# Patient Record
Sex: Male | Born: 1965 | Race: White | Hispanic: No | Marital: Married | Smoking: Never smoker
Health system: Southern US, Community
[De-identification: ages and names within clinical notes are randomized; demographics above are authoritative.]

## PROBLEM LIST (undated history)

## (undated) DIAGNOSIS — Z8249 Family history of ischemic heart disease and other diseases of the circulatory system: Secondary | ICD-10-CM

## (undated) DIAGNOSIS — K4090 Unilateral inguinal hernia, without obstruction or gangrene, not specified as recurrent: Secondary | ICD-10-CM

## (undated) DIAGNOSIS — K219 Gastro-esophageal reflux disease without esophagitis: Secondary | ICD-10-CM

## (undated) DIAGNOSIS — E782 Mixed hyperlipidemia: Secondary | ICD-10-CM

## (undated) HISTORY — PX: HERNIA REPAIR: SHX51

## (undated) HISTORY — DX: Family history of ischemic heart disease and other diseases of the circulatory system: Z82.49

## (undated) HISTORY — DX: Unilateral inguinal hernia, without obstruction or gangrene, not specified as recurrent: K40.90

## (undated) HISTORY — DX: Mixed hyperlipidemia: E78.2

## (undated) HISTORY — DX: Gastro-esophageal reflux disease without esophagitis: K21.9

---

## 2003-05-26 ENCOUNTER — Encounter: Payer: Self-pay | Admitting: Emergency Medicine

## 2003-05-26 ENCOUNTER — Emergency Department (HOSPITAL_COMMUNITY): Admission: EM | Admit: 2003-05-26 | Discharge: 2003-05-26 | Payer: Self-pay | Admitting: Emergency Medicine

## 2003-07-22 ENCOUNTER — Encounter: Payer: Self-pay | Admitting: Emergency Medicine

## 2003-07-22 ENCOUNTER — Encounter: Admission: RE | Admit: 2003-07-22 | Discharge: 2003-07-22 | Payer: Self-pay | Admitting: Emergency Medicine

## 2003-08-06 ENCOUNTER — Encounter: Payer: Self-pay | Admitting: General Surgery

## 2003-08-06 ENCOUNTER — Ambulatory Visit (HOSPITAL_COMMUNITY): Admission: RE | Admit: 2003-08-06 | Discharge: 2003-08-06 | Payer: Self-pay | Admitting: General Surgery

## 2003-12-09 ENCOUNTER — Encounter: Admission: RE | Admit: 2003-12-09 | Discharge: 2003-12-09 | Payer: Self-pay | Admitting: General Surgery

## 2004-07-07 ENCOUNTER — Ambulatory Visit (HOSPITAL_COMMUNITY): Admission: RE | Admit: 2004-07-07 | Discharge: 2004-07-07 | Payer: Self-pay | Admitting: *Deleted

## 2004-07-07 ENCOUNTER — Encounter (INDEPENDENT_AMBULATORY_CARE_PROVIDER_SITE_OTHER): Payer: Self-pay | Admitting: *Deleted

## 2005-01-25 ENCOUNTER — Encounter: Admission: RE | Admit: 2005-01-25 | Discharge: 2005-01-25 | Payer: Self-pay | Admitting: Family Medicine

## 2006-07-26 ENCOUNTER — Emergency Department (HOSPITAL_COMMUNITY): Admission: EM | Admit: 2006-07-26 | Discharge: 2006-07-26 | Payer: Self-pay | Admitting: Emergency Medicine

## 2009-08-18 ENCOUNTER — Ambulatory Visit (HOSPITAL_BASED_OUTPATIENT_CLINIC_OR_DEPARTMENT_OTHER): Admission: RE | Admit: 2009-08-18 | Discharge: 2009-08-18 | Payer: Self-pay | Admitting: General Surgery

## 2011-02-10 LAB — DIFFERENTIAL
Lymphocytes Relative: 28 % (ref 12–46)
Lymphs Abs: 2 10*3/uL (ref 0.7–4.0)
Monocytes Relative: 7 % (ref 3–12)
Neutro Abs: 4.5 10*3/uL (ref 1.7–7.7)
Neutrophils Relative %: 63 % (ref 43–77)

## 2011-02-10 LAB — CBC
RBC: 5.34 MIL/uL (ref 4.22–5.81)
WBC: 7.1 10*3/uL (ref 4.0–10.5)

## 2011-02-10 LAB — BASIC METABOLIC PANEL
Calcium: 9.5 mg/dL (ref 8.4–10.5)
Chloride: 102 mEq/L (ref 96–112)
Creatinine, Ser: 1 mg/dL (ref 0.4–1.5)
GFR calc Af Amer: >60 mL/min (ref >60)
GFR calc non Af Amer: >60 mL/min (ref >60)

## 2011-10-18 ENCOUNTER — Encounter (INDEPENDENT_AMBULATORY_CARE_PROVIDER_SITE_OTHER): Payer: Self-pay | Admitting: General Surgery

## 2011-11-02 ENCOUNTER — Encounter (INDEPENDENT_AMBULATORY_CARE_PROVIDER_SITE_OTHER): Payer: Self-pay | Admitting: General Surgery

## 2011-11-02 ENCOUNTER — Ambulatory Visit (INDEPENDENT_AMBULATORY_CARE_PROVIDER_SITE_OTHER): Payer: BC Managed Care – PPO | Admitting: General Surgery

## 2011-11-02 VITALS — BP 130/84 | HR 68 | Temp 98.1°F | Resp 16 | Ht 75.0 in | Wt 245.4 lb

## 2011-11-02 DIAGNOSIS — R1032 Left lower quadrant pain: Secondary | ICD-10-CM

## 2011-11-02 MED ORDER — PREGABALIN 75 MG PO CAPS: 75.0000 mg | ORAL_CAPSULE | Freq: Two times a day (BID) | ORAL | Status: DC

## 2011-11-02 NOTE — Progress Notes (Signed)
Subjective:     Patient ID: Brian Andrade, male   DOB: 1966/09/06, 45 y.o.   MRN: [id number]  HPI Patient is status post left in the hernia repair by Dr. Zachery Dakins in October of 2010. Recently he has been having some pain at the incision site. It extends superiorly on his abdomen. He has not noticed a bulge in the area. Certain activities and such activity exacerbate the discomfort. He has taken ibuprofen with some relief.  Review of Systems     Objective:   Physical Exam  Neck: Normal range of motion. Neck supple.  Cardiovascular: Normal rate, regular rhythm and normal heart sounds.   Pulmonary/Chest: Effort normal and breath sounds normal. No respiratory distress. He has no wheezes.  Abdominal: Soft. He exhibits no distension. There is no tenderness. There is no rebound.   Both testes are descended. There is no evidence of right inguinal hernia. There is no evidence of recurrent lifting or hernia. There is mild tenderness along the incision site. No masses or signs of infection are seen.    Assessment:     Pain after left inguinal hernia repair likely due to scar tissue affecting the nerves in the area    Plan:     I will try a short course of Lyrica. This should help resolve the nerve irritation. We discussed this in detail. I will see him back in 6 weeks.

## 2012-01-04 ENCOUNTER — Encounter (INDEPENDENT_AMBULATORY_CARE_PROVIDER_SITE_OTHER): Payer: BC Managed Care – PPO | Admitting: General Surgery

## 2013-12-13 ENCOUNTER — Telehealth (INDEPENDENT_AMBULATORY_CARE_PROVIDER_SITE_OTHER): Payer: Self-pay

## 2013-12-13 NOTE — Telephone Encounter (Signed)
Patient calling into office requesting a refill for Lyrica.  Patient sates he was climbing a tree and felt sharp pain.  He denies having any bulges at this time.  Patient last seen on 11/02/2011 and was placed on a short interval of Lyrica for nerve pain however patient is calling back requesting a prescription for this medication.  Patient has scheduled an office visit for 12/23/13 w/Dr. Janee Mornhompson.

## 2013-12-16 ENCOUNTER — Other Ambulatory Visit (INDEPENDENT_AMBULATORY_CARE_PROVIDER_SITE_OTHER): Payer: Self-pay

## 2013-12-16 DIAGNOSIS — R1030 Lower abdominal pain, unspecified: Secondary | ICD-10-CM

## 2013-12-16 MED ORDER — PREGABALIN 75 MG PO CAPS: 75.0000 mg | ORAL_CAPSULE | Freq: Two times a day (BID) | ORAL | Status: DC

## 2013-12-16 NOTE — Telephone Encounter (Signed)
If this can be called in he can have Lyrica 75mg  po bid prn pain #50. Thanks

## 2013-12-16 NOTE — Telephone Encounter (Signed)
RX for lyrica 75 mg sent to [facility] via epic per Dr Carollee Massedhompson's request. Pt to keep appt 12-23-13.

## 2013-12-23 ENCOUNTER — Ambulatory Visit (INDEPENDENT_AMBULATORY_CARE_PROVIDER_SITE_OTHER): Payer: BC Managed Care – PPO | Admitting: General Surgery

## 2013-12-23 ENCOUNTER — Encounter (INDEPENDENT_AMBULATORY_CARE_PROVIDER_SITE_OTHER): Payer: Self-pay | Admitting: General Surgery

## 2013-12-23 VITALS — BP 130/82 | HR 68 | Temp 97.7°F | Resp 16 | Ht 75.0 in | Wt 247.8 lb

## 2013-12-23 DIAGNOSIS — R1032 Left lower quadrant pain: Secondary | ICD-10-CM | POA: Insufficient documentation

## 2013-12-23 DIAGNOSIS — R109 Unspecified abdominal pain: Secondary | ICD-10-CM

## 2013-12-23 MED ORDER — PREGABALIN 75 MG PO CAPS: 75.0000 mg | ORAL_CAPSULE | Freq: Two times a day (BID) | ORAL | Status: DC

## 2013-12-23 NOTE — Progress Notes (Signed)
Subjective:     Patient ID: Brian NasutiRonald C Ebbert, male   DOB: 11/11/1965, 48 y.o.   MRN: [id number]  HPI  Patient status post left single hernia repair with mesh approximately January of 2013. He was climbing a tree to retrieve a deer stand while hunting recently when he twisted and felt some pain in his right inguinal region. He previously had a brief period of neuropathy postop which was treated successfully with Lyrica. He returns to make sure his hernia repair remains intact. He continues to have intermittent pain in the area exacerbated by movement. He is felt no mass. Review of Systems     Objective:   Physical Exam  Constitutional: He is oriented to person, place, and time. He appears well-developed and well-nourished.  HENT:  Head: Normocephalic and atraumatic.  Neck: Neck supple.  Cardiovascular: Normal rate and normal heart sounds.   Pulmonary/Chest: Effort normal and breath sounds normal. No respiratory distress. He has no wheezes. He has no rales.  Abdominal: Soft. Bowel sounds are normal. He exhibits no distension. There is no tenderness.  Left inguinal area as well healed scar, hernia repair is intact, no evidence right inguinal hernia, testes descended and nonedematous  Neurological: He is alert and oriented to person, place, and time.       Assessment:     Re-exacerbation of nerve irritation associated pain status post lifting hernia repair with mesh. No evidence of hernia recurrence.    Plan:     Lyrica 75 mg twice a day. I will see him back in approximately 6 weeks. He will complete one month of treatment. If his pain is resolved, he will not refill. In any case I will see him back for further evaluation.

## 2014-02-05 ENCOUNTER — Encounter (INDEPENDENT_AMBULATORY_CARE_PROVIDER_SITE_OTHER): Payer: BC Managed Care – PPO | Admitting: General Surgery

## 2014-06-24 ENCOUNTER — Emergency Department (HOSPITAL_COMMUNITY)
Admission: EM | Admit: 2014-06-24 | Discharge: 2014-06-24 | Disposition: A | Discharge status: Home / Self Care | Payer: BC Managed Care – PPO | Source: Home / Self Care | Attending: Family Medicine | Admitting: Family Medicine

## 2014-06-24 ENCOUNTER — Encounter (HOSPITAL_COMMUNITY): Payer: Self-pay | Admitting: Emergency Medicine

## 2014-06-24 DIAGNOSIS — R0789 Other chest pain: Secondary | ICD-10-CM

## 2014-06-24 DIAGNOSIS — R071 Chest pain on breathing: Secondary | ICD-10-CM

## 2014-06-24 MED ORDER — KETOROLAC TROMETHAMINE 30 MG/ML IJ SOLN
INTRAMUSCULAR | Status: AC
  Filled 2014-06-24: qty 1

## 2014-06-24 MED ORDER — METAXALONE 800 MG PO TABS: 800.0000 mg | ORAL_TABLET | Freq: Three times a day (TID) | ORAL | Status: DC

## 2014-06-24 MED ORDER — KETOROLAC TROMETHAMINE 30 MG/ML IJ SOLN
30.0000 mg | Freq: Once | INTRAMUSCULAR | Status: AC
  Administered 2014-06-24: 30 mg via INTRAMUSCULAR

## 2014-06-24 NOTE — ED Provider Notes (Signed)
CSN: [id number]     Arrival date & time 06/24/14  9147 History   First MD Initiated Contact with Patient 06/24/14 1001     Chief Complaint  Patient presents with  . Neck Pain   (Consider location/radiation/quality/duration/timing/severity/associated sxs/prior Treatment) Patient is a 48 y.o. male presenting with neck pain. The history is provided by the patient.  Neck Pain Pain location:  L side Quality:  Stabbing Pain radiates to:  L shoulder Pain severity:  Mild Onset quality:  Gradual Progression:  Waxing and waning Chronicity:  New Context: lifting a heavy object   Relieved by:  None tried Worsened by:  Nothing tried Ineffective treatments:  None tried Associated symptoms: chest pain and headaches   Associated symptoms: no leg pain and no numbness   Risk factors comment:  Works in [employer], worried b/o friend died of mi age 59   Past Medical History  Diagnosis Date  . GERD (gastroesophageal reflux disease)   . Elevated cholesterol with elevated triglycerides   . Family history of early CAD   . Left inguinal hernia   . Abdominal pain     lower left   Past Surgical History  Procedure Laterality Date  . Hernia repair  1975, 2010   Family History  Problem Relation Age of Onset  . Hyperlipidemia Mother   . Cancer Mother     breast  . Heart disease Father   . Hyperlipidemia Father   . Hyperlipidemia Paternal Grandmother   . Hypertension Paternal Grandmother   . Diabetes Paternal Grandmother   . Cancer Maternal Grandfather     lung   History  Substance Use Topics  . Smoking status: Never Smoker   . Smokeless tobacco: Former Neurosurgeon  . Alcohol Use: No    Review of Systems  Constitutional: Negative.   Respiratory: Negative for cough and shortness of breath.   Cardiovascular: Positive for chest pain. Negative for palpitations and leg swelling.  Gastrointestinal: Negative.   Musculoskeletal: Positive for neck pain.  Neurological: Positive for headaches.  Negative for numbness.    Allergies  Celebrex  Home Medications   Prior to Admission medications   Medication Sig Start Date End Date Taking? Authorizing Provider  aspirin 81 MG tablet Take 81 mg by mouth daily.    Historical Provider, MD  glucosamine-chondroitin 500-400 MG tablet Take 1 tablet by mouth 3 (three) times daily.      Historical Provider, MD  ibuprofen (ADVIL,MOTRIN) 800 MG tablet as needed. 10/12/11   Historical Provider, MD  metaxalone (SKELAXIN) 800 MG tablet Take 1 tablet (800 mg total) by mouth 3 (three) times daily. As muscle relaxer 06/24/14   Linna Hoff, MD  Multiple Vitamins-Minerals (MULTIVITAMIN PO) Take 1 tablet by mouth daily.      Historical Provider, MD  Omega-3 Fatty Acids (FISH OIL) 600 MG CAPS Take 4 tablets by mouth daily.      Historical Provider, MD  omeprazole (PRILOSEC) 20 MG capsule Take 20 mg by mouth daily.      Historical Provider, MD  pregabalin (LYRICA) 75 MG capsule Take 1 capsule (75 mg total) by mouth 2 (two) times daily. 12/23/13   Liz Malady, MD  Red Yeast Rice 600 MG CAPS Take 2 capsules by mouth daily.      Historical Provider, MD   BP 119/81  Pulse 65  Temp(Src) 97.3 F (36.3 C) (Oral)  Resp 16  SpO2 97% Physical Exam  Nursing note and vitals reviewed. Constitutional: He is oriented to person,  place, and time. He appears well-developed and well-nourished.  Neck: Normal range of motion. Neck supple.  Cardiovascular: Regular rhythm, normal heart sounds and intact distal pulses.   Pulmonary/Chest: Breath sounds normal. He exhibits tenderness.  Abdominal: Soft. Bowel sounds are normal. There is no tenderness.  Lymphadenopathy:    He has no cervical adenopathy.  Neurological: He is alert and oriented to person, place, and time.  Skin: Skin is warm and dry.    ED Course  Procedures (including critical care time) Labs Review Labs Reviewed - No data to display  Imaging Review No results found.   MDM   1. Left-sided  chest wall pain        Linna HoffJames D Joshuan Bolander, MD 06/24/14 1108

## 2014-06-24 NOTE — ED Notes (Signed)
Pt  Reports  Neck  l  Shoulder  l   Arm  l  Side  Of  Chest  Pain  Worse  On  Certain movements  And  posistions   With  Onset  Of  Symptoms  X  4  Days       denys  Any  specefic  injurys     Pt reports  Some  nasusea   As  Well  As  Headache     - he is  Sitting upright ion  The  Exam table  Speaking in  Complete  sentances     Skin is  Warm  And  Dry

## 2014-06-24 NOTE — Discharge Instructions (Signed)
Motrin and heat and muscle relaxer.

## 2015-01-15 ENCOUNTER — Emergency Department (HOSPITAL_COMMUNITY)
Admission: EM | Admit: 2015-01-15 | Discharge: 2015-01-16 | Disposition: A | Discharge status: Home / Self Care | Payer: BLUE CROSS/BLUE SHIELD | Attending: Emergency Medicine | Admitting: Emergency Medicine

## 2015-01-15 ENCOUNTER — Encounter (HOSPITAL_COMMUNITY): Payer: Self-pay | Admitting: Emergency Medicine

## 2015-01-15 ENCOUNTER — Emergency Department (HOSPITAL_COMMUNITY): Payer: BLUE CROSS/BLUE SHIELD

## 2015-01-15 DIAGNOSIS — Z8639 Personal history of other endocrine, nutritional and metabolic disease: Secondary | ICD-10-CM | POA: Insufficient documentation

## 2015-01-15 DIAGNOSIS — K219 Gastro-esophageal reflux disease without esophagitis: Secondary | ICD-10-CM | POA: Insufficient documentation

## 2015-01-15 DIAGNOSIS — R110 Nausea: Secondary | ICD-10-CM | POA: Diagnosis not present

## 2015-01-15 DIAGNOSIS — Z7982 Long term (current) use of aspirin: Secondary | ICD-10-CM | POA: Insufficient documentation

## 2015-01-15 DIAGNOSIS — Z79899 Other long term (current) drug therapy: Secondary | ICD-10-CM | POA: Insufficient documentation

## 2015-01-15 DIAGNOSIS — R079 Chest pain, unspecified: Secondary | ICD-10-CM

## 2015-01-15 DIAGNOSIS — R1013 Epigastric pain: Secondary | ICD-10-CM | POA: Diagnosis not present

## 2015-01-15 LAB — CBC
HCT: 45.7 % (ref 39.0–52.0)
Hemoglobin: 16.5 g/dL (ref 13.0–17.0)
MCH: 30.5 pg (ref 26.0–34.0)
MCHC: 36.1 g/dL — ABNORMAL HIGH (ref 30.0–36.0)
MCV: 84.5 fL (ref 78.0–100.0)
PLATELETS: 156 10*3/uL (ref 150–400)
RBC: 5.41 MIL/uL (ref 4.22–5.81)
RDW: 12 % (ref 11.5–15.5)
WBC: 7.1 10*3/uL (ref 4.0–10.5)

## 2015-01-15 LAB — BASIC METABOLIC PANEL
ANION GAP: 7 (ref 5–15)
BUN: 16 mg/dL (ref 6–23)
CO2: 28 mmol/L (ref 19–32)
Calcium: 9.6 mg/dL (ref 8.4–10.5)
Chloride: 104 mmol/L (ref 96–112)
Creatinine, Ser: 1.11 mg/dL (ref 0.50–1.35)
GFR calc Af Amer: 89 mL/min — ABNORMAL LOW (ref >90)
GFR calc non Af Amer: 77 mL/min — ABNORMAL LOW (ref >90)
Glucose, Bld: 118 mg/dL — ABNORMAL HIGH (ref 70–99)
POTASSIUM: 3.8 mmol/L (ref 3.5–5.1)
Sodium: 139 mmol/L (ref 135–145)

## 2015-01-15 LAB — I-STAT TROPONIN, ED: TROPONIN I, POC: 0 ng/mL (ref 0.00–0.08)

## 2015-01-15 NOTE — ED Provider Notes (Signed)
CSN: [id number]     Arrival date & time 01/15/15  2117 History  This chart was scribed for Brian Booze, MD by Richarda Overlie, ED Scribe. This patient was seen in room A08C/A08C and the patient's care was started 12:00 AM.    Chief Complaint  Patient presents with  . Chest Pain   The history is provided by the patient. No language interpreter was used.   HPI Comments: EARNIE Andrade is a 49 y.o. male with a history of GERD who presents to the Emergency Department complaining of worsening, left sided CP for the last 4 days. Pt describes the pain as burning and says that his pain radiates down his left shoulder and into his left arm. He rates his CP as a 4/10 at this time and rates it as a 6/10 at its worst. He says that his pain worsens with deep breathing. Pt reports associated nausea as well. Pt reports he has taken aleve, ibuprofen and asprin which have failed to provide him pain relief. He states he is still taking Prilosec and Omeprazole regularly. Pt states his last dose of aspirin  was yesterday morning. He reports a similar prior episode a few years ago and reports that his cardiac workup was normal. Pt reports that his father had a heart blockage at 73 years old. Pt reports no alleviating factors at this time. He denies SOB, vomiting and diaphoresis.   PCP - Dr. Azucena Cecil   Past Medical History  Diagnosis Date  . GERD (gastroesophageal reflux disease)   . Elevated cholesterol with elevated triglycerides   . Family history of early CAD   . Left inguinal hernia   . Abdominal pain     lower left   Past Surgical History  Procedure Laterality Date  . Hernia repair  1975, 2010   Family History  Problem Relation Age of Onset  . Hyperlipidemia Mother   . Cancer Mother     breast  . Heart disease Father   . Hyperlipidemia Father   . Hyperlipidemia Paternal Grandmother   . Hypertension Paternal Grandmother   . Diabetes Paternal Grandmother   . Cancer Maternal Grandfather     lung    History  Substance Use Topics  . Smoking status: Never Smoker   . Smokeless tobacco: Former Neurosurgeon  . Alcohol Use: No    Review of Systems  Constitutional: Negative for diaphoresis.  Respiratory: Negative for shortness of breath.   Cardiovascular: Positive for chest pain.  Gastrointestinal: Positive for nausea. Negative for vomiting.  All other systems reviewed and are negative.     Allergies  Celebrex  Home Medications   Prior to Admission medications   Medication Sig Start Date End Date Taking? Authorizing Provider  aspirin 81 MG tablet Take 81 mg by mouth daily.    Historical Provider, MD  glucosamine-chondroitin 500-400 MG tablet Take 1 tablet by mouth 3 (three) times daily.      Historical Provider, MD  ibuprofen (ADVIL,MOTRIN) 800 MG tablet as needed. 10/12/11   Historical Provider, MD  metaxalone (SKELAXIN) 800 MG tablet Take 1 tablet (800 mg total) by mouth 3 (three) times daily. As muscle relaxer 06/24/14   Linna Hoff, MD  Multiple Vitamins-Minerals (MULTIVITAMIN PO) Take 1 tablet by mouth daily.      Historical Provider, MD  Omega-3 Fatty Acids (FISH OIL) 600 MG CAPS Take 4 tablets by mouth daily.      Historical Provider, MD  omeprazole (PRILOSEC) 20 MG capsule Take 20 mg  by mouth daily.      Historical Provider, MD  pregabalin (LYRICA) 75 MG capsule Take 1 capsule (75 mg total) by mouth 2 (two) times daily. 12/23/13   Violeta GelinasBurke Thompson, MD  Red Yeast Rice 600 MG CAPS Take 2 capsules by mouth daily.      Historical Provider, MD   BP 114/79 mmHg  Pulse 51  Temp(Src) 98.5 F (36.9 C)  Resp 18  SpO2 99% Physical Exam  Constitutional: He is oriented to person, place, and time. He appears well-developed and well-nourished.  HENT:  Head: Normocephalic and atraumatic.  Eyes: Pupils are equal, round, and reactive to light. Right eye exhibits no discharge. Left eye exhibits no discharge.  Neck: Normal range of motion. Neck supple. No JVD present.  Cardiovascular: Normal  rate, regular rhythm and normal heart sounds.   No murmur heard. Pulmonary/Chest: Effort normal. He has no wheezes. He has no rales. He exhibits no tenderness.  Abdominal: Soft. He exhibits no distension and no mass. There is tenderness.  Mild epigastric tenderness.   Musculoskeletal: Normal range of motion. He exhibits no edema.  Lymphadenopathy:    He has no cervical adenopathy.  Neurological: He is alert and oriented to person, place, and time. No cranial nerve deficit. Coordination normal.  Skin: Skin is warm and dry. No rash noted.  Psychiatric: He has a normal mood and affect. His behavior is normal. Thought content normal.  Nursing note and vitals reviewed.   ED Course  Procedures   DIAGNOSTIC STUDIES: Oxygen Saturation is 98% on RA, normal by my interpretation.    COORDINATION OF CARE: 12:09 AM Discussed treatment plan with pt at bedside and pt agreed to plan.   Labs Review Results for orders placed or performed during the hospital encounter of 01/15/15  CBC  Result Value Ref Range   WBC 7.1 4.0 - 10.5 K/uL   RBC 5.41 4.22 - 5.81 MIL/uL   Hemoglobin 16.5 13.0 - 17.0 g/dL   HCT 62.145.7 30.839.0 - 65.752.0 %   MCV 84.5 78.0 - 100.0 fL   MCH 30.5 26.0 - 34.0 pg   MCHC 36.1 (H) 30.0 - 36.0 g/dL   RDW 84.612.0 96.211.5 - 95.215.5 %   Platelets 156 150 - 400 K/uL  Basic metabolic panel  Result Value Ref Range   Sodium 139 135 - 145 mmol/L   Potassium 3.8 3.5 - 5.1 mmol/L   Chloride 104 96 - 112 mmol/L   CO2 28 19 - 32 mmol/L   Glucose, Bld 118 (H) 70 - 99 mg/dL   BUN 16 6 - 23 mg/dL   Creatinine, Ser 8.411.11 0.50 - 1.35 mg/dL   Calcium 9.6 8.4 - 32.410.5 mg/dL   GFR calc non Af Amer 77 (L) >90 mL/min   GFR calc Af Amer 89 (L) >90 mL/min   Anion gap 7 5 - 15  I-Stat Troponin, ED (not [facility])  Result Value Ref Range   Troponin i, poc 0.00 0.00 - 0.08 ng/mL   Comment 3           Imaging Review Dg Chest 2 View  01/15/2015   CLINICAL DATA:  Left-sided chest and arm pain for 5 days.  EXAM:  CHEST  2 VIEW  COMPARISON:  07/26/2006  FINDINGS: The heart size and mediastinal contours are within normal limits. Both lungs are clear. The visualized skeletal structures are unremarkable.  IMPRESSION: No active cardiopulmonary disease.   Electronically Signed   By: Marisa CyphersWilliam  Stevens M.D.  On: 01/15/2015 22:25   US Abdomen Complete  01/16/2015   CLINICAL DATA:  Epigastric pain.  EXAM: ULTRASOUND ABDOMEN COMPLETE  COMPARISON:  None.  FINDINGS: Gallbladder: Partially distended. No gallstones or wall thickening visualized. No sonographic Murphy sign noted.  Common bile duct: Diameter: Normal, 4.4 mm.  Liver: No focal lesion identified. Within normal limits in parenchymal echogenicity.  IVC: No abnormality visualized.  Pancreas: Not well visualized, completely obscured by bowel gas.  Spleen: Elongated measuring 15.0 Cm  Right Kidney: Length: 11.2 cm. Echogenicity within normal limits. No mass or hydronephrosis visualized.  Left Kidney: Length: 11.6 cm. Echogenicity within normal limits. No mass or hydronephrosis visualized.  Abdominal aorta: No aneurysm visualized.  Other findings: None.  No ascites.  IMPRESSION: 1. 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 2. Otherwise unremarkable abdominal ultrasound.   Electronically Signed   By: Rubye Oaks M.D.   On: 01/16/2015 02:19     EKG Interpretation   Date/Time:  Thursday January 15 2015 21:20:42 EST Ventricular Rate:  58 PR Interval:  160 QRS Duration: 104 QT Interval:  378 QTC Calculation: 371 R Axis:   11 Text Interpretation:  Sinus bradycardia Septal infarct , age undetermined  Abnormal ECG When compared with ECG of 06/24/2014, No significant change  was found Confirmed by [facility]  MD, Jennesis Ramaswamy ([id number]) on 01/15/2015 11:55:04 PM      MDM   Final diagnoses:  Epigastric pain  Chest pain, unspecified chest pain type    Chest pain) not seem likely to be related to coronary artery disease. It is constant burning with some  epigastric tenderness. He has known problems with esophageal reflux. He was given a GI cocktail which did not significantly change the pain. Is also worried about possible biliary tract disease so ultrasound was obtained which did not show any evidence of cholelithiasis. He is discharged with prescription for tramadol and is referred back to PCP. On review of past records, he does have a prior ED visit for chest wall pain. I do feel that he would benefit from outpatient stress testing. He is also advised to increase his omeprazole to twice a day for the next 2 weeks.   I personally performed the services described in this documentation, which was scribed in my presence. The recorded information has been reviewed and is accurate.       Brian Booze, MD 01/16/15 [phone]

## 2015-01-15 NOTE — ED Notes (Signed)
[facility] for further evaluation of left sided chest pain for the past 4 days- pt reports pain has gotten worse today.  Pain radiates down left arm and into left shoulder.  Pain is worse with inspiration.  Respirations e/u at present, no acute distress noted.

## 2015-01-16 ENCOUNTER — Emergency Department (HOSPITAL_COMMUNITY): Payer: BLUE CROSS/BLUE SHIELD

## 2015-01-16 MED ORDER — GI COCKTAIL ~~LOC~~
30.0000 mL | Freq: Once | ORAL | Status: AC
  Administered 2015-01-16: 30 mL via ORAL
  Filled 2015-01-16: qty 30

## 2015-01-16 MED ORDER — TRAMADOL HCL 50 MG PO TABS: 50.0000 mg | ORAL_TABLET | Freq: Four times a day (QID) | ORAL | Status: DC | PRN

## 2015-01-16 MED ORDER — ASPIRIN 81 MG PO CHEW
324.0000 mg | CHEWABLE_TABLET | Freq: Once | ORAL | Status: AC
  Administered 2015-01-16: 324 mg via ORAL
  Filled 2015-01-16: qty 4

## 2015-01-16 NOTE — Discharge Instructions (Signed)
Start taking your omeprazole (Prilosec) twice a day. Return if symptoms are getting worse.  Chest Pain (Nonspecific) It is often hard to give a specific diagnosis for the cause of chest pain. There is always a chance that your pain could be related to something serious, such as a heart attack or a blood clot in the lungs. You need to follow up with your health care provider for further evaluation. CAUSES   Heartburn.  Pneumonia or bronchitis.  Anxiety or stress.  Inflammation around your heart (pericarditis) or lung (pleuritis or pleurisy).  A blood clot in the lung.  A collapsed lung (pneumothorax). It can develop suddenly on its own (spontaneous pneumothorax) or from trauma to the chest.  Shingles infection (herpes zoster virus). The chest wall is composed of bones, muscles, and cartilage. Any of these can be the source of the pain.  The bones can be bruised by injury.  The muscles or cartilage can be strained by coughing or overwork.  The cartilage can be affected by inflammation and become sore (costochondritis). DIAGNOSIS  Lab tests or other studies may be needed to find the cause of your pain. Your health care provider may have you take a test called an ambulatory electrocardiogram (ECG). An ECG records your heartbeat patterns over a 24-hour period. You may also have other tests, such as:  Transthoracic echocardiogram (TTE). During echocardiography, sound waves are used to evaluate how blood flows through your heart.  Transesophageal echocardiogram (TEE).  Cardiac monitoring. This allows your health care provider to monitor your heart rate and rhythm in real time.  Holter monitor. This is a portable device that records your heartbeat and can help diagnose heart arrhythmias. It allows your health care provider to track your heart activity for several days, if needed.  Stress tests by exercise or by giving medicine that makes the heart beat faster. TREATMENT   Treatment  depends on what may be causing your chest pain. Treatment may include:  Acid blockers for heartburn.  Anti-inflammatory medicine.  Pain medicine for inflammatory conditions.  Antibiotics if an infection is present.  You may be advised to change lifestyle habits. This includes stopping smoking and avoiding alcohol, caffeine, and chocolate.  You may be advised to keep your head raised (elevated) when sleeping. This reduces the chance of acid going backward from your stomach into your esophagus. Most of the time, nonspecific chest pain will improve within 2-3 days with rest and mild pain medicine.  HOME CARE INSTRUCTIONS   If antibiotics were prescribed, take them as directed. Finish them even if you start to feel better.  For the next few days, avoid physical activities that bring on chest pain. Continue physical activities as directed.  Do not use any tobacco products, including cigarettes, chewing tobacco, or electronic cigarettes.  Avoid drinking alcohol.  Only take medicine as directed by your health care provider.  Follow your health care provider's suggestions for further testing if your chest pain does not go away.  Keep any follow-up appointments you made. If you do not go to an appointment, you could develop lasting (chronic) problems with pain. If there is any problem keeping an appointment, call to reschedule. SEEK MEDICAL CARE IF:   Your chest pain does not go away, even after treatment.  You have a rash with blisters on your chest.  You have a fever. SEEK IMMEDIATE MEDICAL CARE IF:   You have increased chest pain or pain that spreads to your arm, neck, jaw, back, or abdomen.  You have shortness of breath.  You have an increasing cough, or you cough up blood.  You have severe back or abdominal pain.  You feel nauseous or vomit.  You have severe weakness.  You faint.  You have chills. This is an emergency. Do not wait to see if the pain will go away. Get  medical help at once. Call your local emergency services (911 in [place] Do not drive yourself to the hospital. MAKE SURE YOU:   Understand these instructions.  Will watch your condition.  Will get help right away if you are not doing well or get worse. Document Released: 08/03/2005 Document Revised: 10/29/2013 Document Reviewed: 05/29/2008 [facility] Patient Information 2015 [place], [place]. This information is not intended to replace advice given to you by your health care provider. Make sure you discuss any questions you have with your health care provider.  Tramadol tablets What is this medicine? TRAMADOL (TRA ma dole) is a pain reliever. It is used to treat moderate to severe pain in adults. This medicine may be used for other purposes; ask your health care provider or pharmacist if you have questions. COMMON BRAND NAME(S): Ultram What should I tell my health care provider before I take this medicine? They need to know if you have any of these conditions: -brain tumor -depression -drug abuse or addiction -head injury -if you frequently drink alcohol containing drinks -kidney disease or trouble passing urine -liver disease -lung disease, asthma, or breathing problems -seizures or epilepsy -suicidal thoughts, plans, or attempt; a previous suicide attempt by you or a family member -an unusual or allergic reaction to tramadol, codeine, other medicines, foods, dyes, or preservatives -pregnant or trying to get pregnant -breast-feeding How should I use this medicine? Take this medicine by mouth with a full glass of water. Follow the directions on the prescription label. If the medicine upsets your stomach, take it with food or milk. Do not take more medicine than you are told to take. Talk to your pediatrician regarding the use of this medicine in children. Special care may be needed. Overdosage: If you think you have taken too much of this medicine contact a poison control center or  emergency room at once. NOTE: This medicine is only for you. Do not share this medicine with others. What if I miss a dose? If you miss a dose, take it as soon as you can. If it is almost time for your next dose, take only that dose. Do not take double or extra doses. What may interact with this medicine? Do not take this medicine with any of the following medications: -MAOIs like Carbex, Eldepryl, Marplan, Nardil, and Parnate This medicine may also interact with the following medications: -alcohol or medicines that contain alcohol -antihistamines -benzodiazepines -bupropion -carbamazepine or oxcarbazepine -clozapine -cyclobenzaprine -digoxin -furazolidone -linezolid -medicines for depression, anxiety, or psychotic disturbances -medicines for migraine headache like almotriptan, eletriptan, frovatriptan, naratriptan, rizatriptan, sumatriptan, zolmitriptan -medicines for pain like pentazocine, buprenorphine, butorphanol, meperidine, nalbuphine, and propoxyphene -medicines for sleep -muscle relaxants -naltrexone -phenobarbital -phenothiazines like perphenazine, thioridazine, chlorpromazine, mesoridazine, fluphenazine, prochlorperazine, promazine, and trifluoperazine -procarbazine -warfarin This list may not describe all possible interactions. Give your health care provider a list of all the medicines, herbs, non-prescription drugs, or dietary supplements you use. Also tell them if you smoke, drink alcohol, or use illegal drugs. Some items may interact with your medicine. What should I watch for while using this medicine? Tell your doctor or health care professional if your pain does not go  away, if it gets worse, or if you have new or a different type of pain. You may develop tolerance to the medicine. Tolerance means that you will need a higher dose of the medicine for pain relief. Tolerance is normal and is expected if you take this medicine for a long time. Do not suddenly stop taking  your medicine because you may develop a severe reaction. Your body becomes used to the medicine. This does NOT mean you are addicted. Addiction is a behavior related to getting and using a drug for a non-medical reason. If you have pain, you have a medical reason to take pain medicine. Your doctor will tell you how much medicine to take. If your doctor wants you to stop the medicine, the dose will be slowly lowered over time to avoid any side effects. You may get drowsy or dizzy. Do not drive, use machinery, or do anything that needs mental alertness until you know how this medicine affects you. Do not stand or sit up quickly, especially if you are an older patient. This reduces the risk of dizzy or fainting spells. Alcohol can increase or decrease the effects of this medicine. Avoid alcoholic drinks. You may have constipation. Try to have a bowel movement at least every 2 to 3 days. If you do not have a bowel movement for 3 days, call your doctor or health care professional. Your mouth may get dry. Chewing sugarless gum or sucking hard candy, and drinking plenty of water may help. Contact your doctor if the problem does not go away or is severe. What side effects may I notice from receiving this medicine? Side effects that you should report to your doctor or health care professional as soon as possible: -allergic reactions like skin rash, itching or hives, swelling of the face, lips, or tongue -breathing difficulties, wheezing -confusion -itching -light headedness or fainting spells -redness, blistering, peeling or loosening of the skin, including inside the mouth -seizures Side effects that usually do not require medical attention (report to your doctor or health care professional if they continue or are bothersome): -constipation -dizziness -drowsiness -headache -nausea, vomiting This list may not describe all possible side effects. Call your doctor for medical advice about side effects. You  may report side effects to FDA at 1-800-FDA-1088. Where should I keep my medicine? Keep out of the reach of children. Store at room temperature between 15 and 30 degrees C (59 and 86 degrees F). Keep container tightly closed. Throw away any unused medicine after the expiration date. NOTE: This sheet is a summary. It may not cover all possible information. If you have questions about this medicine, talk to your doctor, pharmacist, or health care provider.  2015, [employer]. (2010-07-07 11:55:44)

## 2015-03-16 ENCOUNTER — Other Ambulatory Visit: Payer: Self-pay | Admitting: Family Medicine

## 2015-03-16 ENCOUNTER — Ambulatory Visit
Admission: RE | Admit: 2015-03-16 | Discharge: 2015-03-16 | Disposition: A | Discharge status: Home / Self Care | Payer: BLUE CROSS/BLUE SHIELD | Source: Ambulatory Visit | Attending: Family Medicine | Admitting: Family Medicine

## 2015-03-16 DIAGNOSIS — R059 Cough, unspecified: Secondary | ICD-10-CM

## 2015-03-16 DIAGNOSIS — R05 Cough: Secondary | ICD-10-CM

## 2015-05-19 ENCOUNTER — Other Ambulatory Visit: Payer: Self-pay | Admitting: Family Medicine

## 2015-05-19 DIAGNOSIS — R1031 Right lower quadrant pain: Secondary | ICD-10-CM

## 2015-05-21 ENCOUNTER — Ambulatory Visit
Admission: RE | Admit: 2015-05-21 | Discharge: 2015-05-21 | Disposition: A | Discharge status: Home / Self Care | Payer: BLUE CROSS/BLUE SHIELD | Source: Ambulatory Visit | Attending: Family Medicine | Admitting: Family Medicine

## 2015-05-21 DIAGNOSIS — R1031 Right lower quadrant pain: Secondary | ICD-10-CM

## 2015-05-21 MED ORDER — IOPAMIDOL (ISOVUE-300) INJECTION 61%
125.0000 mL | Freq: Once | INTRAVENOUS | Status: AC | PRN
  Administered 2015-05-21: 125 mL via INTRAVENOUS

## 2016-03-17 IMAGING — CR DG CHEST 2V
2 series · 2 of 2 positions shown · non-contrast
Comparison: 07/26/2006

CLINICAL DATA: Left-sided chest and arm pain for 5 days.

EXAM:
CHEST  2 VIEW

[chest pa]
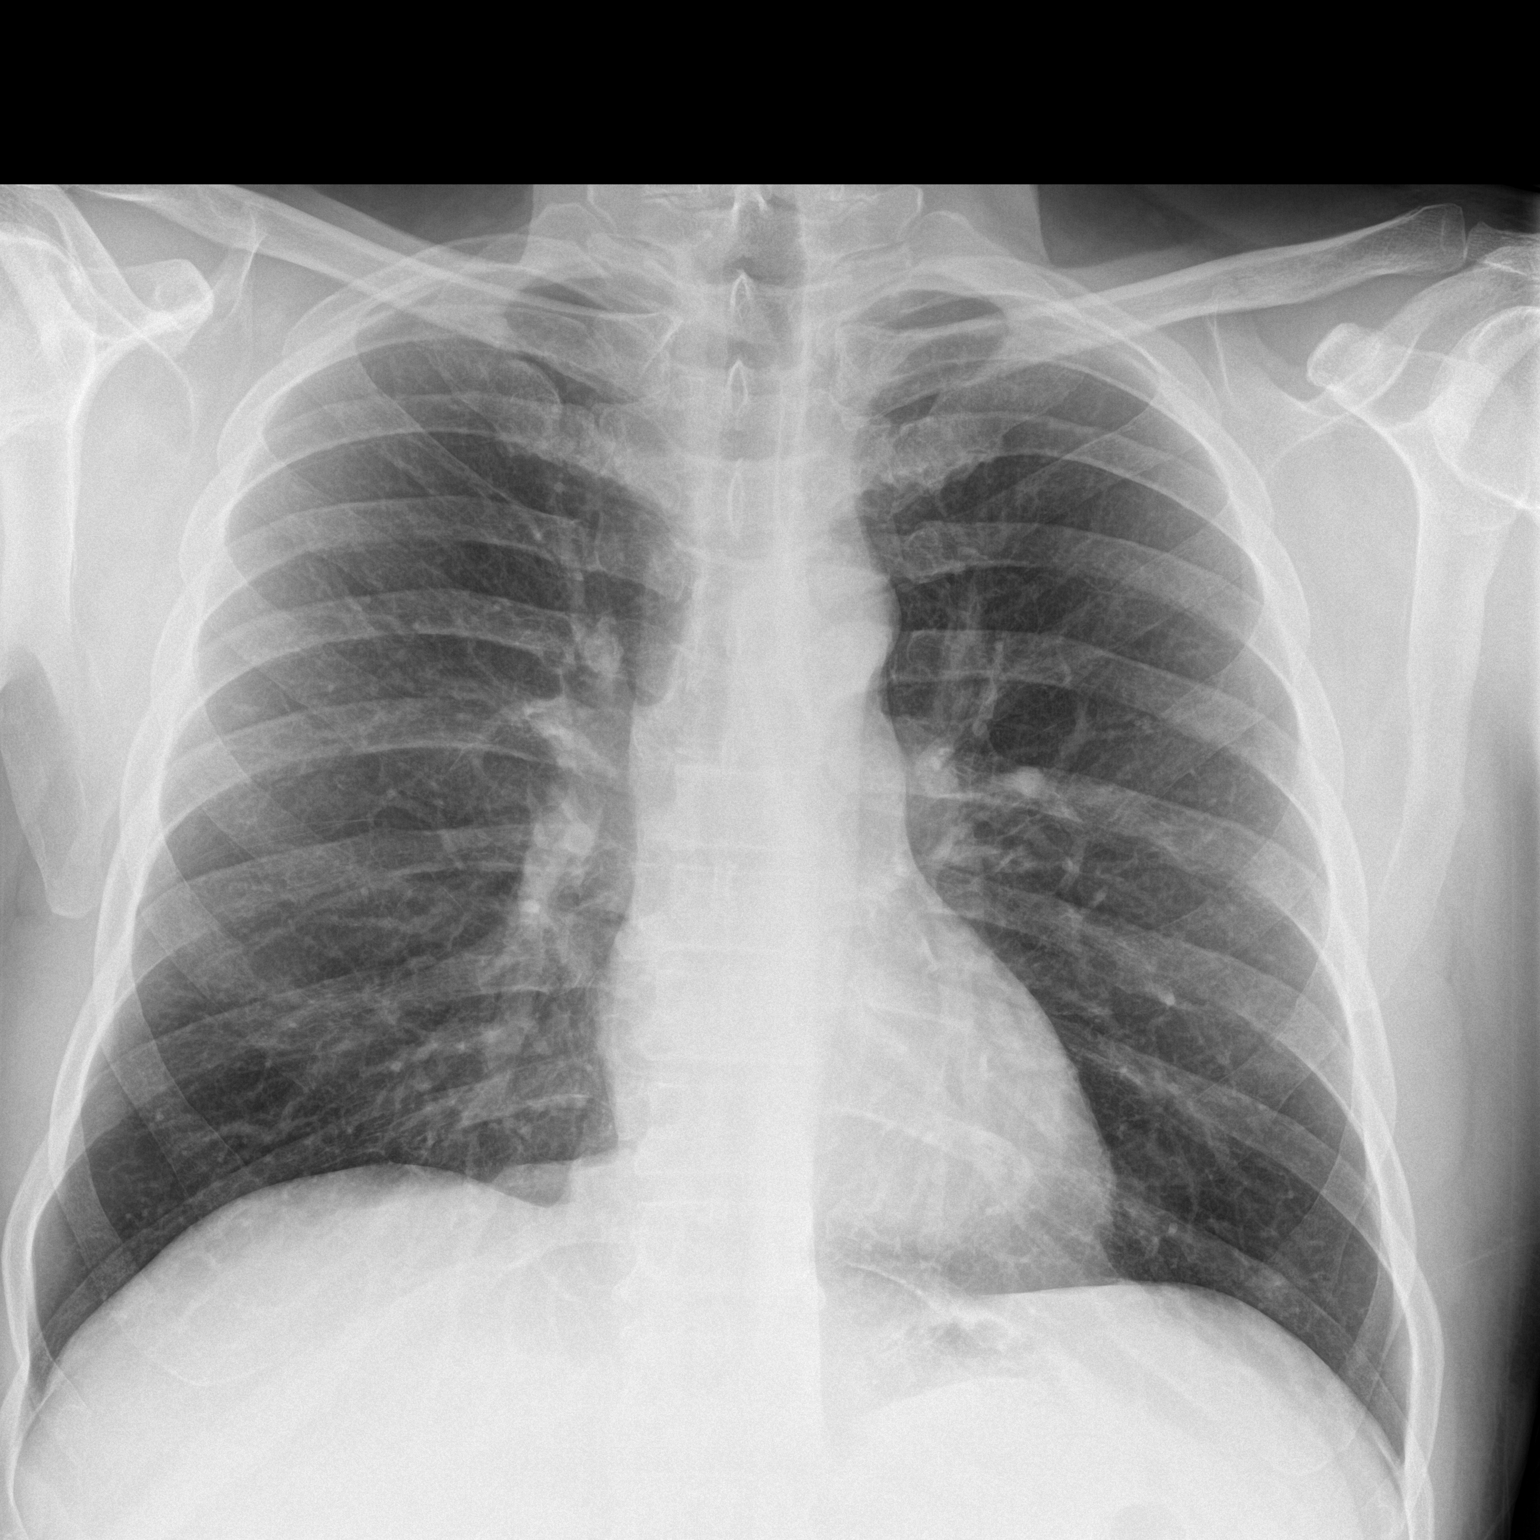

[chest lat]
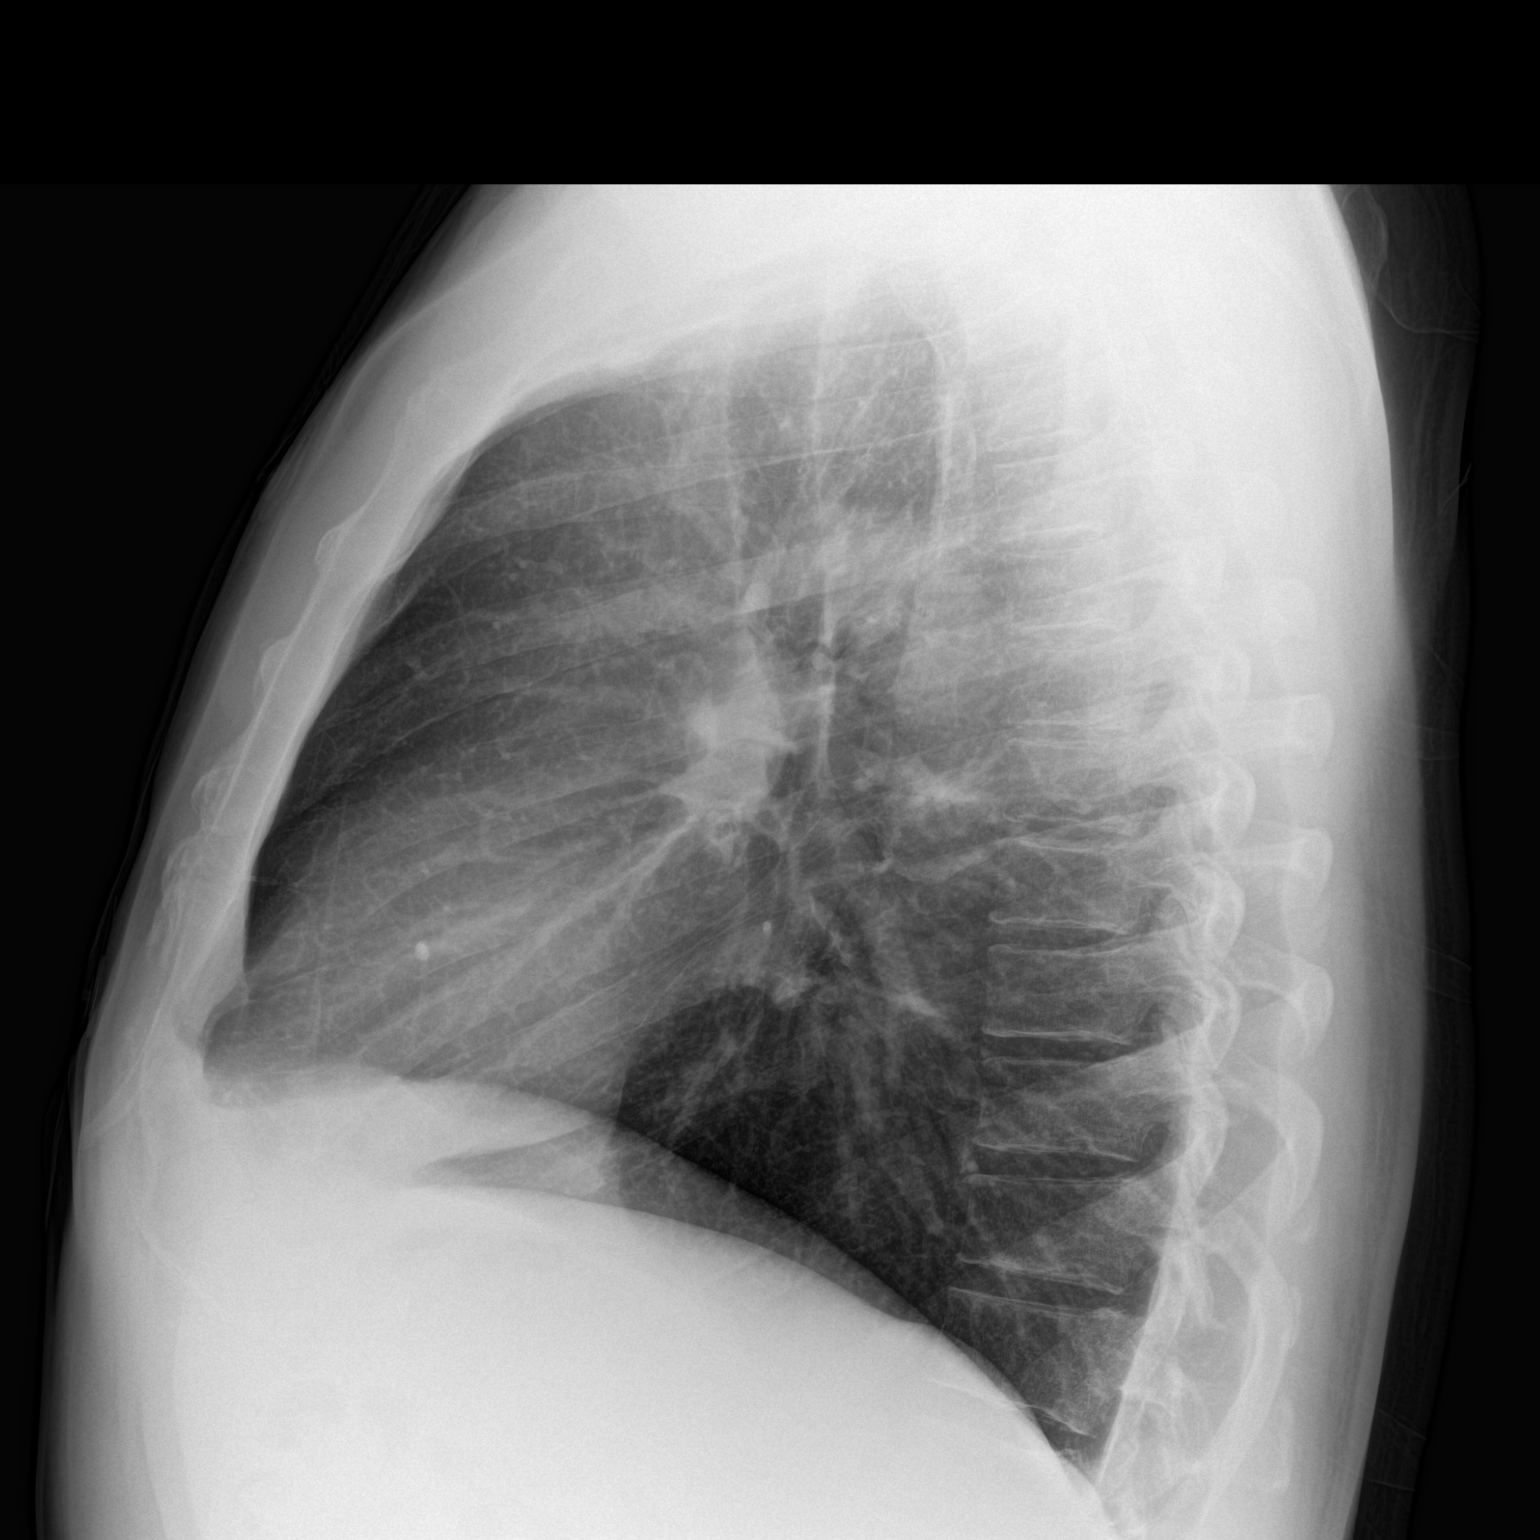

[2 of 2 positions shown; findings below may reference images not displayed]

FINDINGS: The heart size and mediastinal contours are within normal limits.
Both lungs are clear. The visualized skeletal structures are
unremarkable.
IMPRESSION: No active cardiopulmonary disease.

## 2016-03-18 IMAGING — US US ABDOMEN COMPLETE
1 series · 14 of 25 positions shown · non-contrast
Comparison: None.

CLINICAL DATA: Epigastric pain.

EXAM:
ULTRASOUND ABDOMEN COMPLETE

[Series 1: us abdomen complete · 0.26mm/px · 14 of 45 slices shown]
[im 1/45]
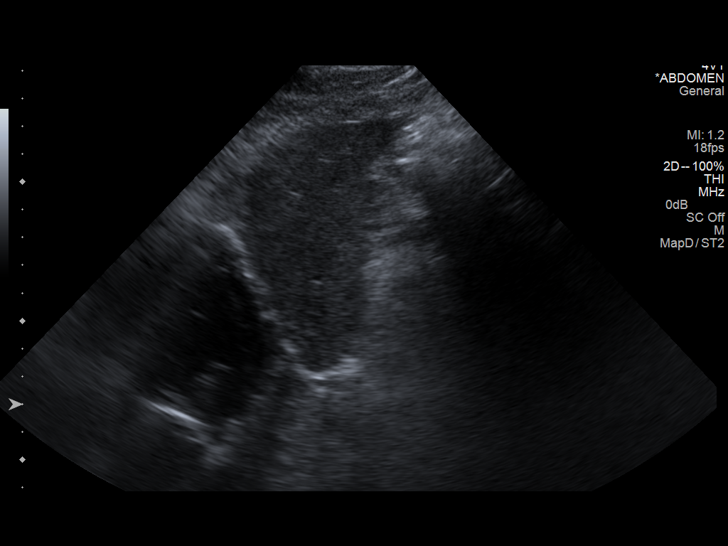
[im 4/45]
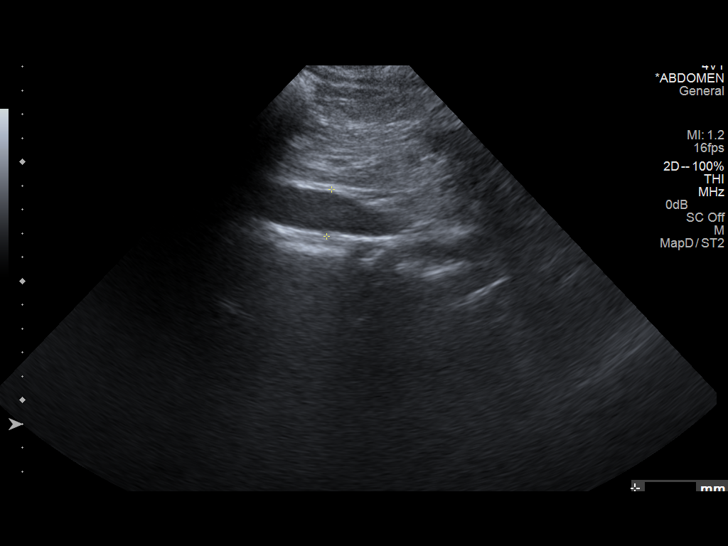
[im 8/45]
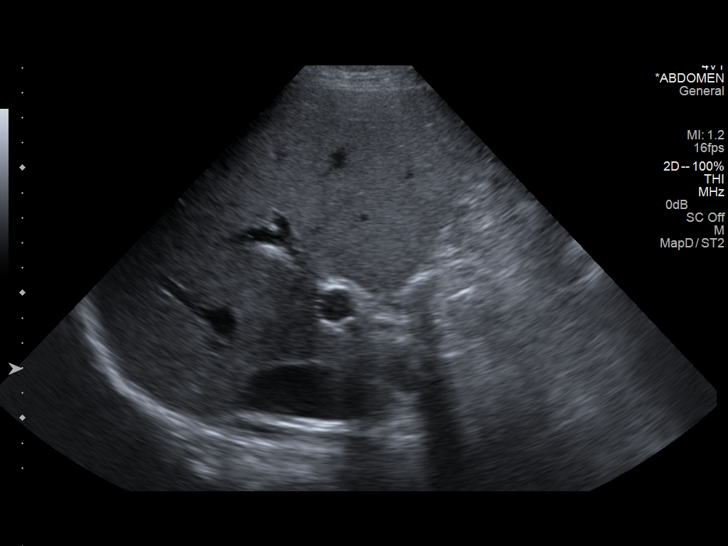
[im 12/45]
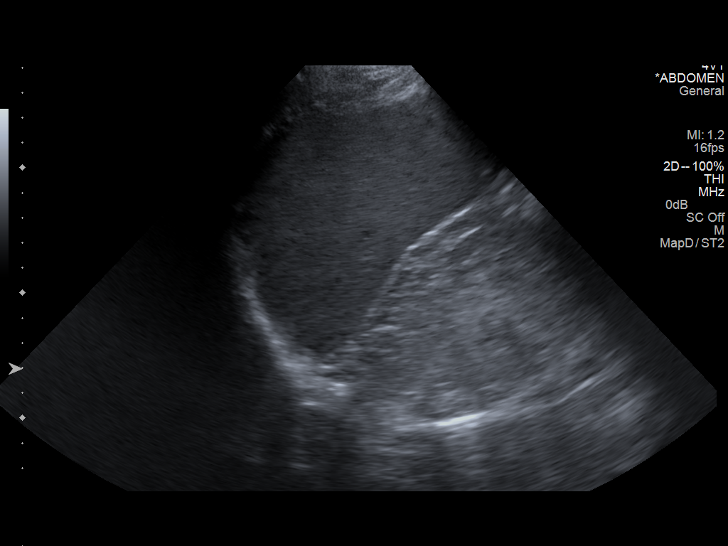
[im 15/45]
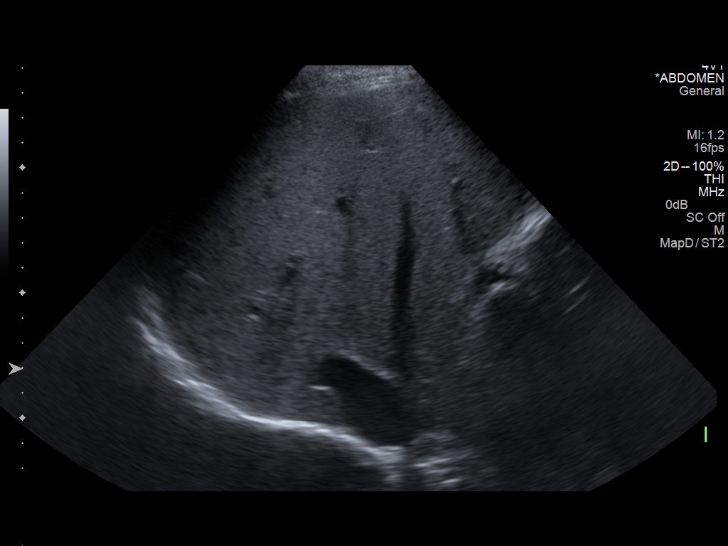
[im 17/45]
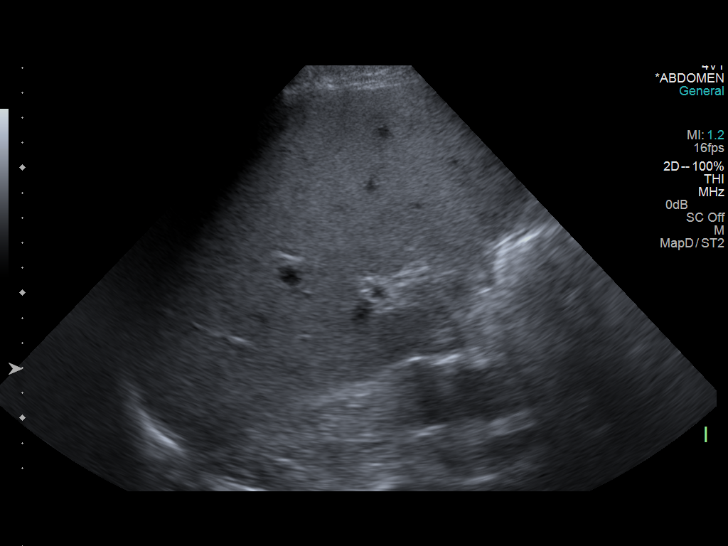
[im 21/45]
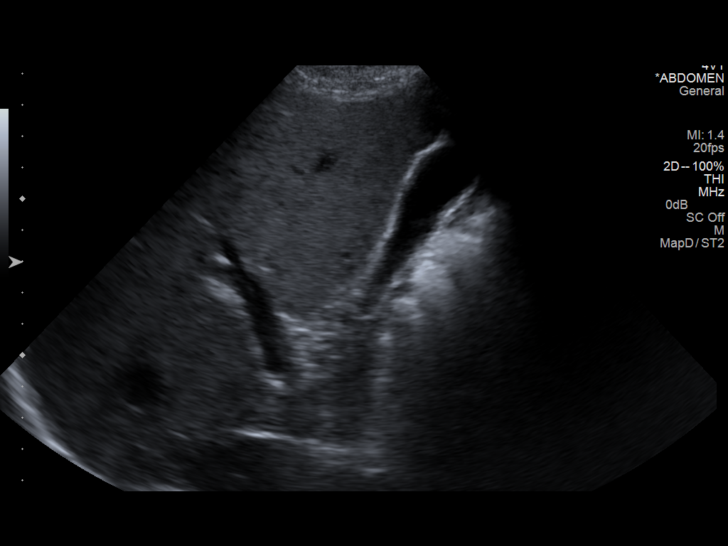
[im 24/45]
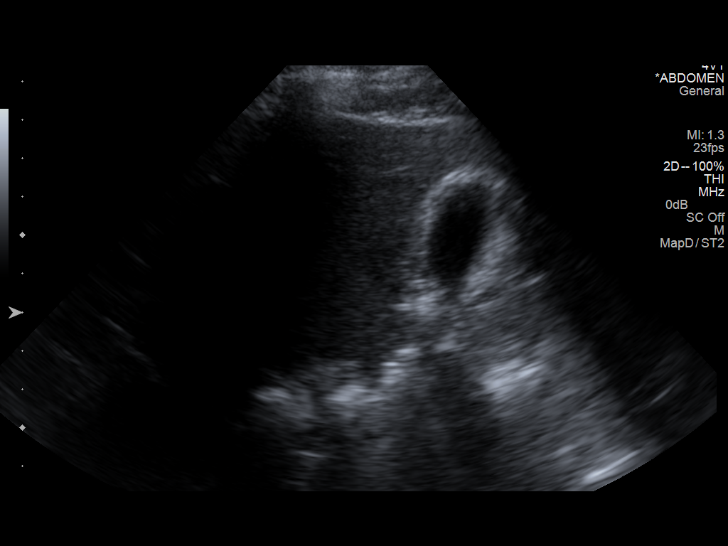
[im 28/45]
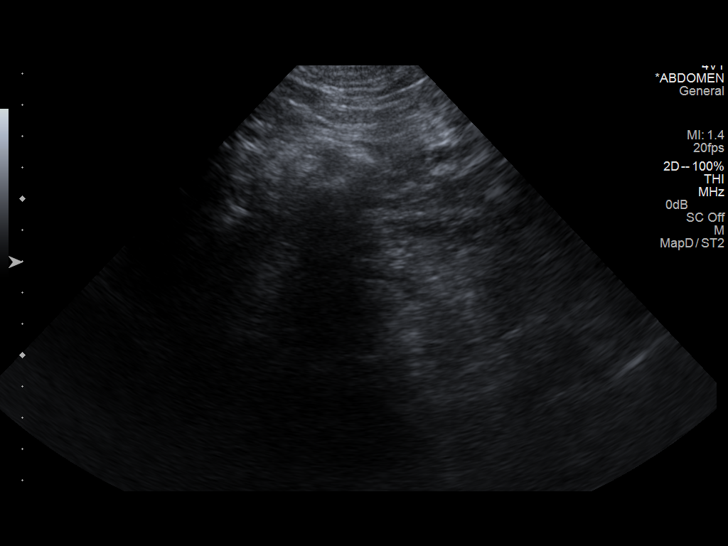
[im 30/45]
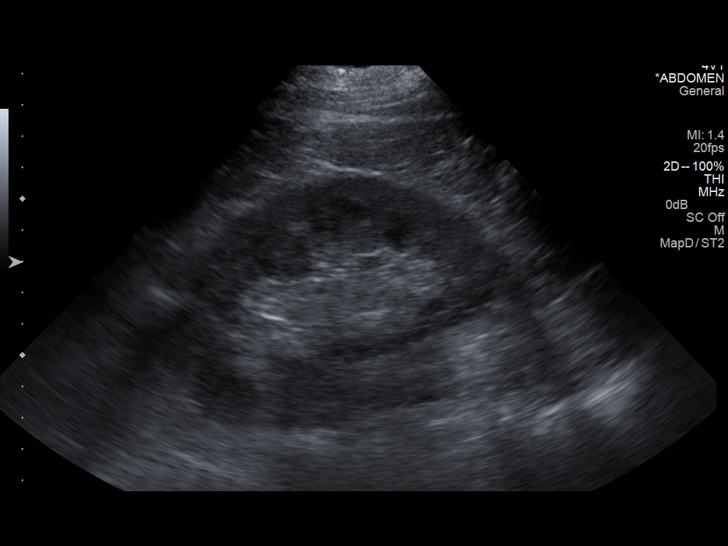
[im 34/45]
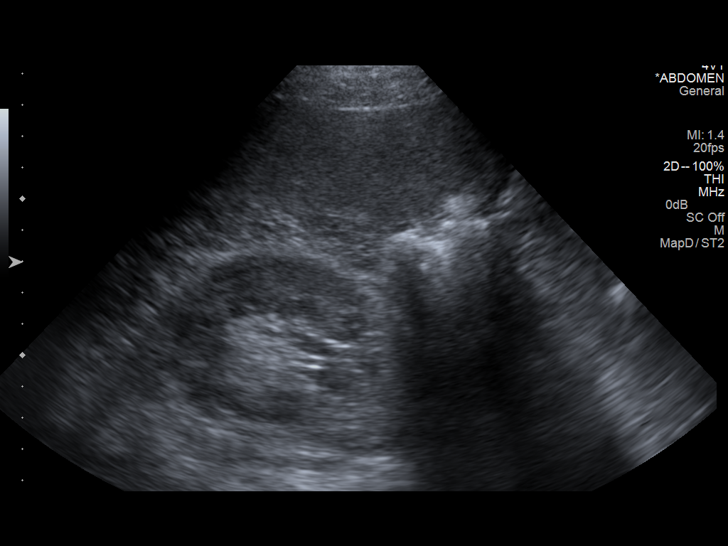
[im 37/45]
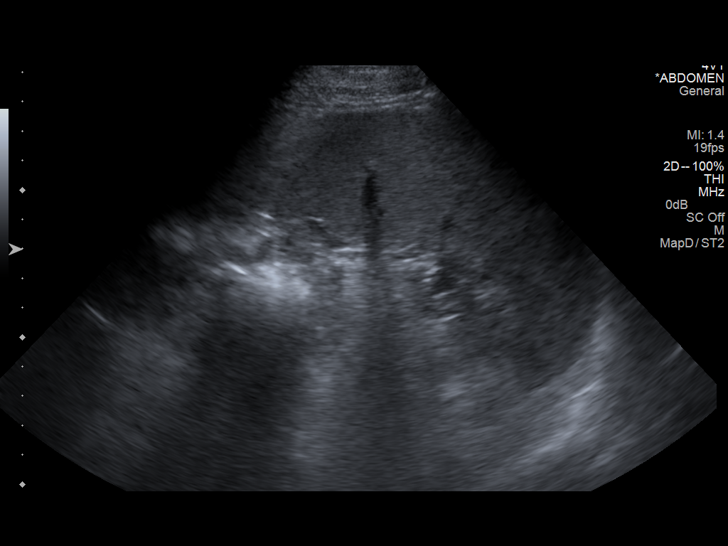
[im 41/45]
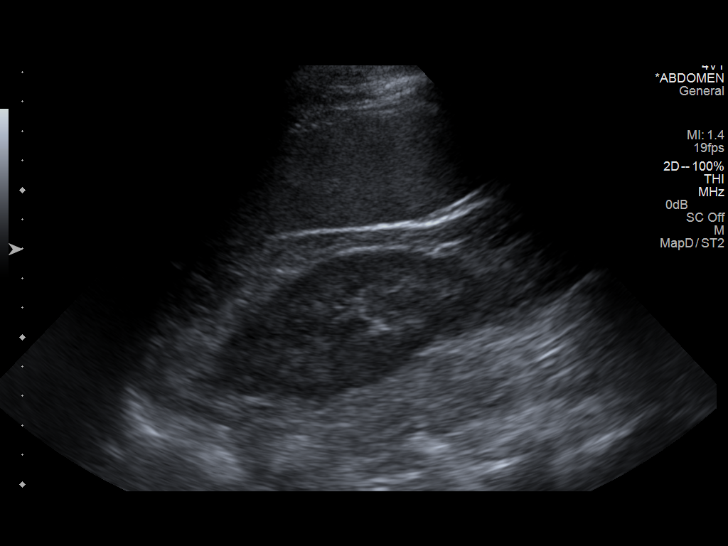
[im 45/45]
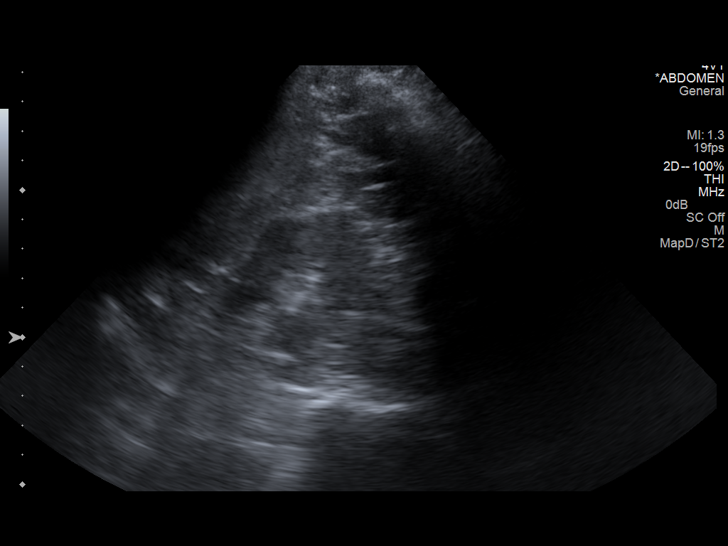

[14 of 25 positions shown; findings below may reference images not displayed]

FINDINGS: Gallbladder: Partially distended. No gallstones or wall thickening
visualized. No sonographic Murphy sign noted.

Common bile duct: Diameter: Normal, 4.4 mm.

Liver: No focal lesion identified. Within normal limits in
parenchymal echogenicity.

IVC: No abnormality visualized.

Pancreas: Not well visualized, completely obscured by bowel gas.

Spleen: Elongated measuring 15.0 Cm

Right Kidney: Length: 11.2 cm. Echogenicity within normal limits. No
mass or hydronephrosis visualized.

Left Kidney: Length: 11.6 cm. Echogenicity within normal limits. No
mass or hydronephrosis visualized.

Abdominal aorta: No aneurysm visualized.

Other findings: None.  No ascites.
IMPRESSION: 1. Elongated spleen measuring 15 cm without focal abnormality.
2. Otherwise unremarkable abdominal ultrasound.

## 2016-06-24 ENCOUNTER — Ambulatory Visit (HOSPITAL_COMMUNITY)
Admission: EM | Admit: 2016-06-24 | Discharge: 2016-06-24 | Disposition: A | Discharge status: Home / Self Care | Payer: BLUE CROSS/BLUE SHIELD | Attending: Internal Medicine | Admitting: Internal Medicine

## 2016-06-24 ENCOUNTER — Encounter (HOSPITAL_COMMUNITY): Payer: Self-pay | Admitting: *Deleted

## 2016-06-24 DIAGNOSIS — S0502XA Injury of conjunctiva and corneal abrasion without foreign body, left eye, initial encounter: Secondary | ICD-10-CM | POA: Diagnosis not present

## 2016-06-24 MED ORDER — TETRACAINE HCL 0.5 % OP SOLN
OPHTHALMIC | Status: AC
  Filled 2016-06-24: qty 2

## 2016-06-24 MED ORDER — POLYMYXIN B-TRIMETHOPRIM 10000-0.1 UNIT/ML-% OP SOLN: 1.0000 [drp] | OPHTHALMIC | 0 refills | Status: DC

## 2016-06-24 MED ORDER — TRAMADOL HCL 50 MG PO TABS: 50.0000 mg | ORAL_TABLET | Freq: Four times a day (QID) | ORAL | 0 refills | Status: DC | PRN

## 2016-06-24 NOTE — ED Provider Notes (Signed)
CSN: [id number]     Arrival date & time 06/24/16  1718 History   First MD Initiated Contact with Patient 06/24/16 1805     Chief Complaint  Patient presents with  . Eye Injury   (Consider location/radiation/quality/duration/timing/severity/associated sxs/prior Treatment) HPI Brian Andrade is a 50 y.o. male presenting to UC accompanied by his wife, with c/o persistent Left eye pain and redness after getting a piece of wood in his eye around 11AM this morning while cutting wood with a circular saw.  Pain is sharp and burning, moderate in severity, worse with light.  He attempted to rinse his eye several times but still has pain and discomfort. He has not had anything for pain. Last Tetanus 7 years ago.  He does not wear glasses or contacts. No other injuries.    Past Medical History:  Diagnosis Date  . Abdominal pain    lower left  . Elevated cholesterol with elevated triglycerides   . Family history of early CAD   . GERD (gastroesophageal reflux disease)   . Left inguinal hernia    Past Surgical History:  Procedure Laterality Date  . HERNIA REPAIR  1975, 2010   Family History  Problem Relation Age of Onset  . Hyperlipidemia Mother   . Cancer Mother     breast  . Heart disease Father   . Hyperlipidemia Father   . Hyperlipidemia Paternal Grandmother   . Hypertension Paternal Grandmother   . Diabetes Paternal Grandmother   . Cancer Maternal Grandfather     lung   Social History  Substance Use Topics  . Smoking status: Never Smoker  . Smokeless tobacco: Former NeurosurgeonUser  . Alcohol use No    Review of Systems  Eyes: Positive for photophobia, pain and redness. Negative for itching.  Skin: Negative for color change and wound.  Neurological: Negative for dizziness, light-headedness and headaches.    Allergies  Celebrex [celecoxib]  Home Medications   Prior to Admission medications   Medication Sig Start Date End Date Taking? Authorizing Provider  aspirin 81 MG tablet Take  81 mg by mouth daily.    Historical Provider, MD  glucosamine-chondroitin 500-400 MG tablet Take 1 tablet by mouth 3 (three) times daily.      Historical Provider, MD  ibuprofen (ADVIL,MOTRIN) 800 MG tablet as needed. 10/12/11   Historical Provider, MD  metaxalone (SKELAXIN) 800 MG tablet Take 1 tablet (800 mg total) by mouth 3 (three) times daily. As muscle relaxer Patient not taking: Reported on 01/16/2015 06/24/14   Linna HoffJames D Kindl, MD  Multiple Vitamin (MULTIVITAMIN WITH MINERALS) TABS tablet Take 1 tablet by mouth daily.    Historical Provider, MD  Multiple Vitamins-Minerals (MULTIVITAMIN PO) Take 1 tablet by mouth daily.      Historical Provider, MD  Omega-3 Fatty Acids (FISH OIL) 600 MG CAPS Take 4 tablets by mouth daily.      Historical Provider, MD  omeprazole (PRILOSEC) 20 MG capsule Take 20 mg by mouth daily.      Historical Provider, MD  pregabalin (LYRICA) 75 MG capsule Take 1 capsule (75 mg total) by mouth 2 (two) times daily. Patient not taking: Reported on 01/16/2015 12/23/13   Violeta GelinasBurke Thompson, MD  Red Yeast Rice 600 MG CAPS Take 2 capsules by mouth daily.      Historical Provider, MD  traMADol (ULTRAM) 50 MG tablet Take 1 tablet (50 mg total) by mouth every 6 (six) hours as needed. 01/16/15   Dione Boozeavid Glick, MD  trimethoprim-polymyxin b (POLYTRIM)  ophthalmic solution Place 1 drop into the left eye every 4 (four) hours. For 5 days 06/24/16   Junius FinnerErin O'Malley, PA-C  trimethoprim-polymyxin b (POLYTRIM) ophthalmic solution Place 1 drop into the right eye every 4 (four) hours. 06/24/16   Junius FinnerErin O'Malley, PA-C   Meds Ordered and Administered this Visit  Medications - No data to display  BP 134/88 (BP Location: Right Arm)   Pulse 60   Temp 97.8 F (36.6 C) (Oral)   Resp 18   SpO2 98%  No data found.   Physical Exam  Constitutional: He is oriented to person, place, and time. He appears well-developed and well-nourished.  HENT:  Head: Normocephalic and atraumatic.  Eyes: EOM and lids are normal.  Pupils are equal, round, and reactive to light. Lids are everted and swept, no foreign bodies found. No foreign body present in the left eye. Left conjunctiva is injected.  Left eye: injected, tearing. No foreign body seen. Fluorescein uptake c/w corneal abrasion.   Neck: Normal range of motion.  Cardiovascular: Normal rate.   Pulmonary/Chest: Effort normal.  Musculoskeletal: Normal range of motion.  Neurological: He is alert and oriented to person, place, and time.  Skin: Skin is warm and dry.  Psychiatric: He has a normal mood and affect. His behavior is normal.  Nursing note and vitals reviewed.   Urgent Care Course   Clinical Course    Procedures (including critical care time)  Labs Review Labs Reviewed - No data to display  Imaging Review No results found.   Visual Acuity Review- W/o correction  Right Eye Distance:   20/20 Left Eye Distance:   20/25 Bilateral Distance:     MDM   1. Left corneal abrasion, initial encounter    Pt c/o Left eye pain.   Exam c/w corneal abrasion w/o signs of foreign body.  Rx: polytrim ophthalmic drops and tramadol  Encouraged f/u with PCP next week if not improving, f/u with eye doctor sooner if symptoms worsening. Patient verbalized understanding and agreement with treatment plan.    Junius Finnerrin O'Malley, PA-C 06/24/16 1835

## 2016-06-24 NOTE — ED Triage Notes (Signed)
Pt was   Working  On  [employer] when  [employer]  Piece  Of wood flew  Into  l  Eye  [place]  [place]   Pt   Reports  Pain in the  l eye      And  Blinking  present

## 2016-06-24 NOTE — ED Notes (Signed)
Visual  Acuity  20/20 r      20/25  l

## 2016-06-24 NOTE — Discharge Instructions (Signed)
°  Tramadol is strong pain medication. While taking, do not drink alcohol, drive, or perform any other activities that requires focus while taking these medications.  ° °

## 2017-01-10 ENCOUNTER — Ambulatory Visit
Admission: RE | Admit: 2017-01-10 | Discharge: 2017-01-10 | Disposition: A | Discharge status: Home / Self Care | Payer: BLUE CROSS/BLUE SHIELD | Source: Ambulatory Visit | Attending: Family Medicine | Admitting: Family Medicine

## 2017-01-10 ENCOUNTER — Other Ambulatory Visit: Payer: Self-pay | Admitting: Family Medicine

## 2017-01-10 DIAGNOSIS — R053 Chronic cough: Secondary | ICD-10-CM

## 2017-01-10 DIAGNOSIS — R05 Cough: Secondary | ICD-10-CM

## 2018-07-31 DIAGNOSIS — J029 Acute pharyngitis, unspecified: Secondary | ICD-10-CM | POA: Insufficient documentation

## 2018-07-31 DIAGNOSIS — K219 Gastro-esophageal reflux disease without esophagitis: Secondary | ICD-10-CM | POA: Insufficient documentation

## 2018-11-01 ENCOUNTER — Other Ambulatory Visit: Payer: Self-pay | Admitting: Otolaryngology

## 2018-11-01 DIAGNOSIS — R1310 Dysphagia, unspecified: Secondary | ICD-10-CM

## 2018-11-01 DIAGNOSIS — M542 Cervicalgia: Secondary | ICD-10-CM | POA: Insufficient documentation

## 2018-11-06 ENCOUNTER — Ambulatory Visit
Admission: RE | Admit: 2018-11-06 | Discharge: 2018-11-06 | Disposition: A | Discharge status: Home / Self Care | Payer: BLUE CROSS/BLUE SHIELD | Source: Ambulatory Visit | Attending: Otolaryngology | Admitting: Otolaryngology

## 2018-11-06 DIAGNOSIS — R1310 Dysphagia, unspecified: Secondary | ICD-10-CM

## 2018-11-06 MED ORDER — IOPAMIDOL (ISOVUE-300) INJECTION 61%
75.0000 mL | Freq: Once | INTRAVENOUS | Status: AC | PRN
  Administered 2018-11-06: 75 mL via INTRAVENOUS

## 2020-01-07 IMAGING — RF DG ESOPHAGUS
7 of 8 series · 14 of 21 positions shown · non-contrast
Comparison: None.

CLINICAL DATA: Odynophagia

EXAM:
ESOPHOGRAM / BARIUM SWALLOW / BARIUM TABLET STUDY
TECHNIQUE: Combined double contrast and single contrast examination performed
using effervescent crystals, thick barium liquid, and thin barium
liquid. The patient was observed with fluoroscopy swallowing a 13 mm
barium sulphate tablet.
FLUOROSCOPY TIME:  Fluoroscopy Time:  1 minutes 6 seconds
Radiation Exposure Index (if provided by the fluoroscopic device):
167 mGy
Number of Acquired Spot Images: 0

[Series 1: sequence · 0.28mm/px · 3 of 13 frames shown (1 of 6)]
[frame 2/13]
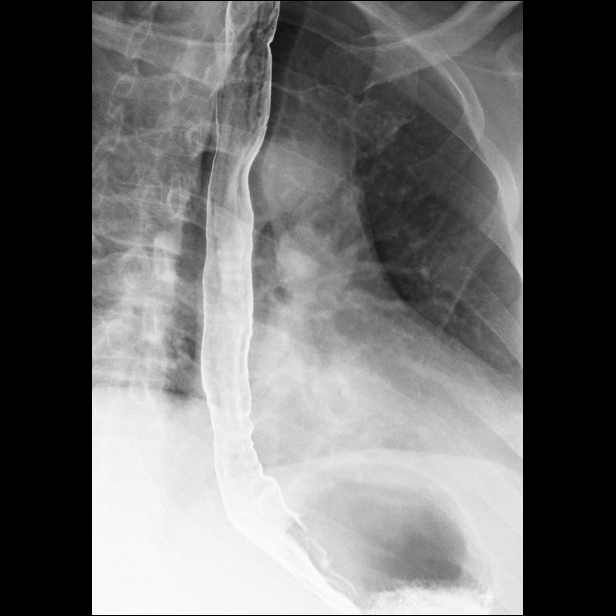
[frame 8/13]
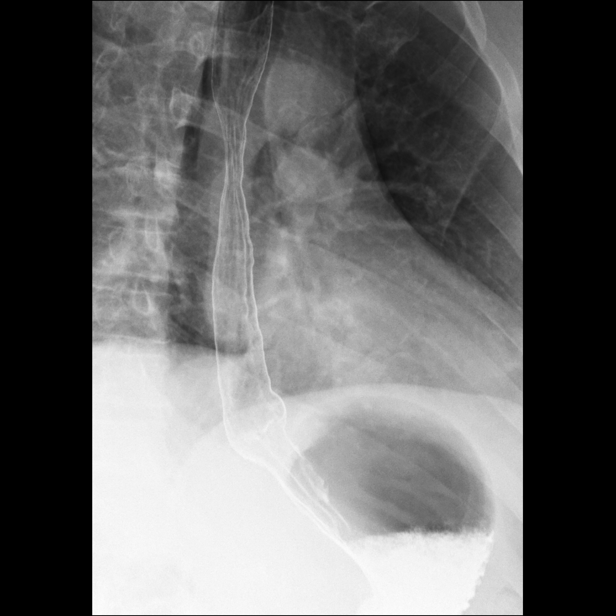
[frame 12/13]
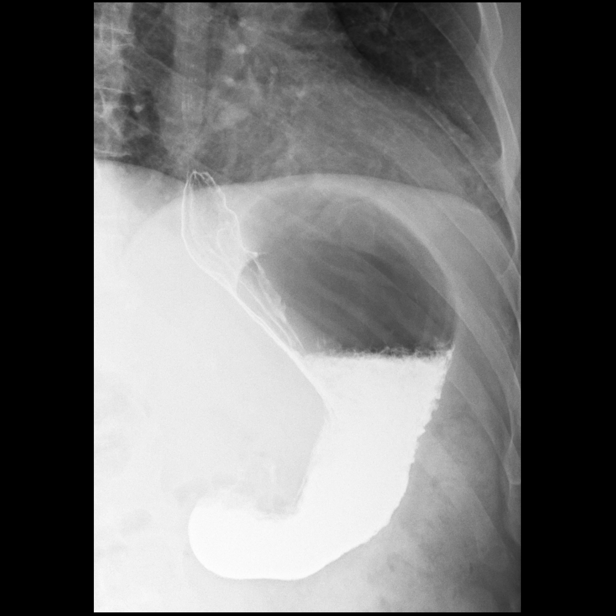

[Series 2: sequence · 0.28mm/px · 2 of 15 frames shown (2 of 6)]
[frame 8/15]
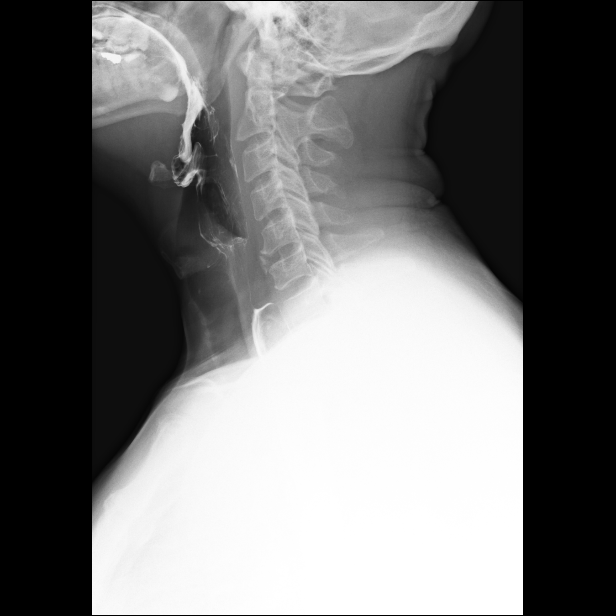
[frame 13/15]
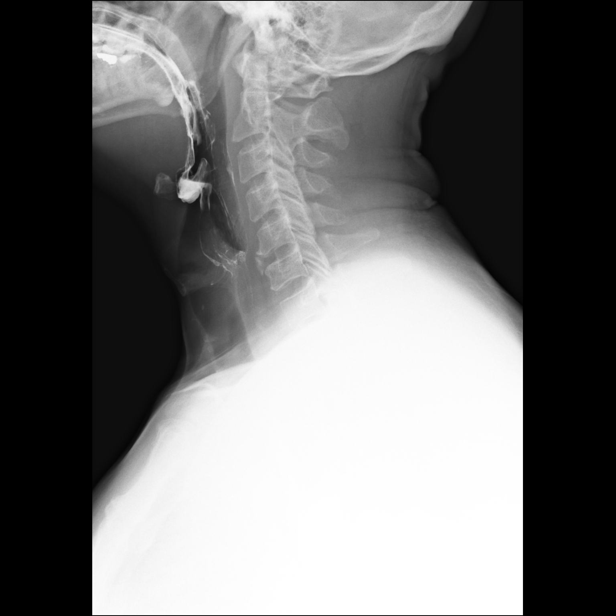

[Series 3: sequence · 0.28mm/px · 3 of 31 frames shown (3 of 6)]
[frame 5/31]
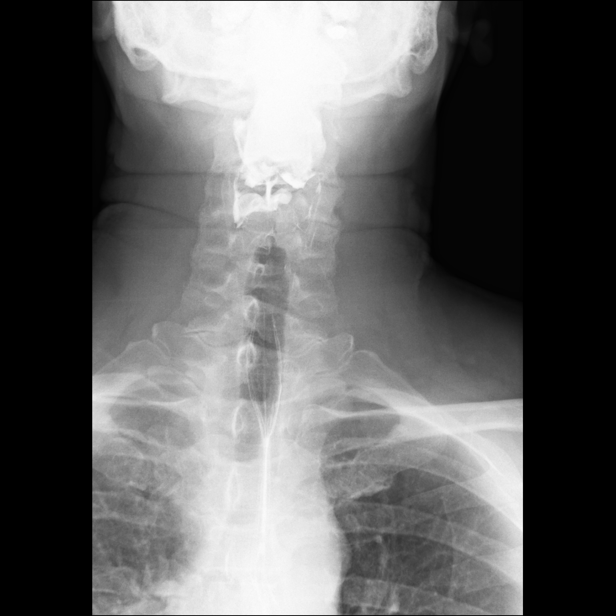
[frame 6/31]
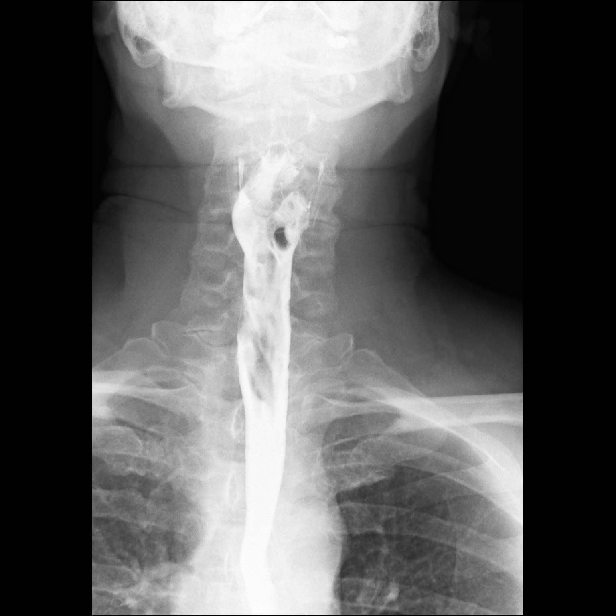
[frame 27/31]
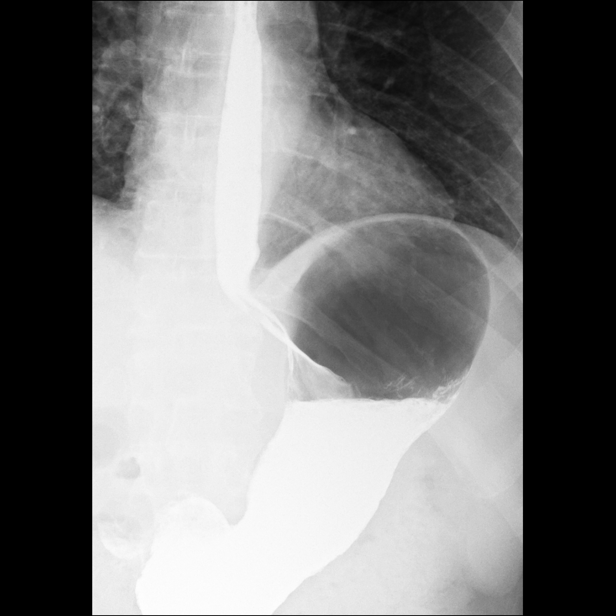

[Series 4: sequence · 0.28mm/px · 2 of 20 frames shown (4 of 6)]
[frame 4/20]
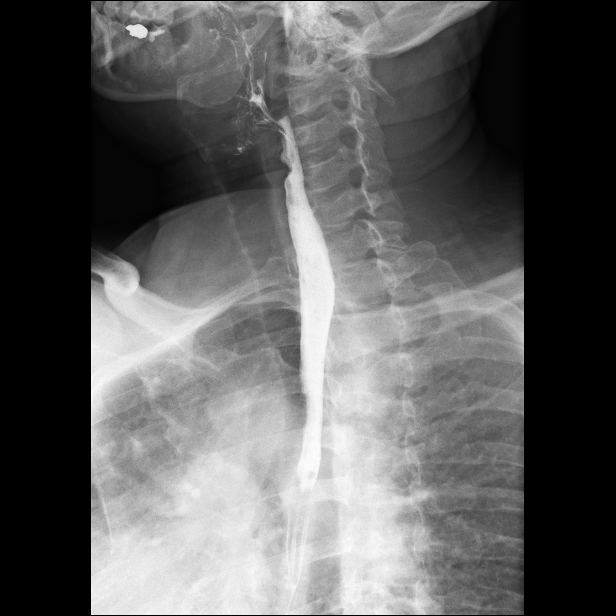
[frame 18/20]
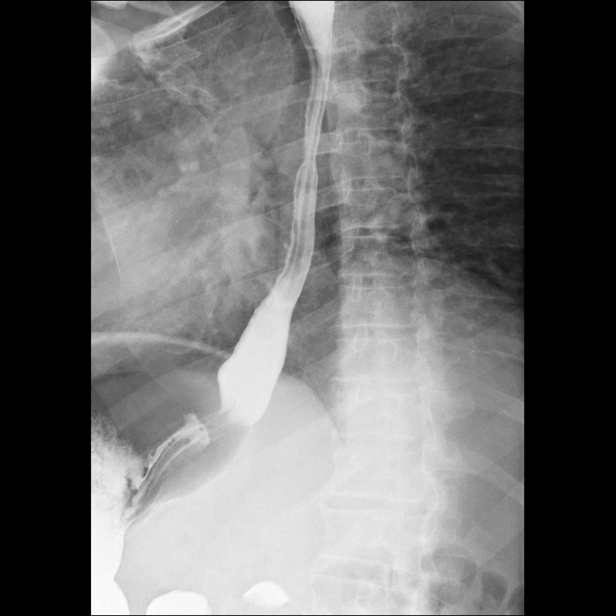

[Series 5: sequence · 0.28mm/px · 2 of 3 frames shown (5 of 6)]
[frame 1/3]
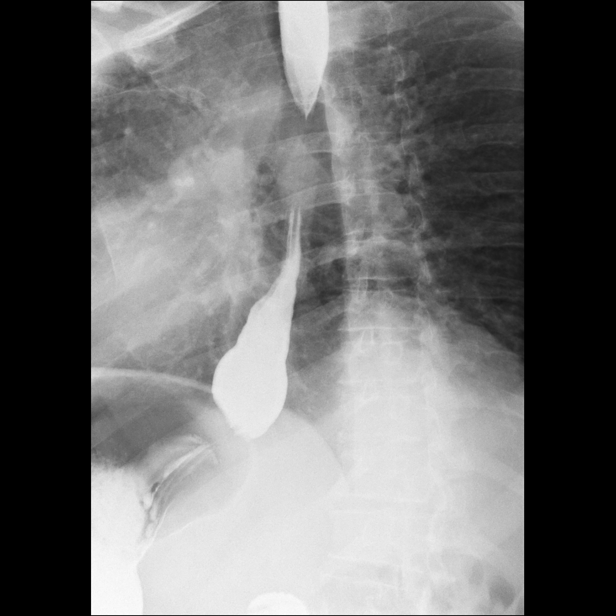
[frame 3/3]
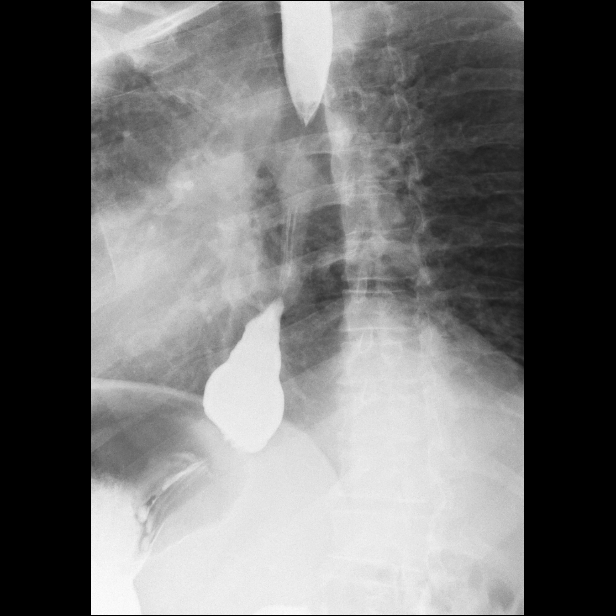

[Series 6: sequence · 0.28mm/px · 1 of 1 slices shown (6 of 6)]
[im 1/1]
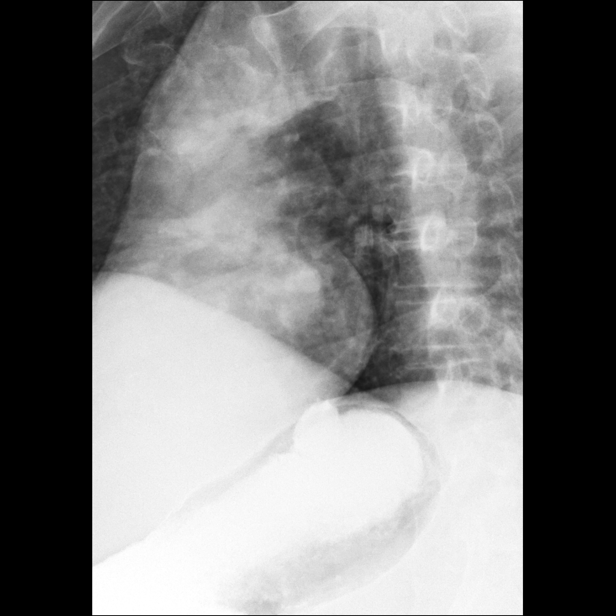

[Series 8: one shot · 1 of 1 slices shown]
[im 1/1]
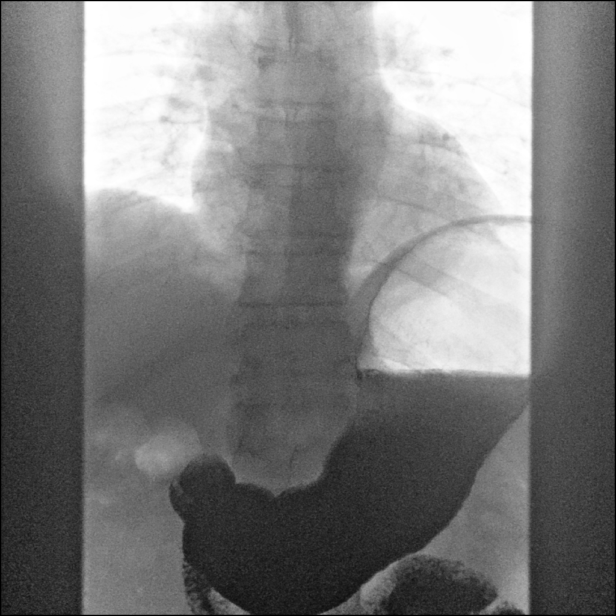

[14 of 21 positions shown; findings below may reference images not displayed]

FINDINGS: Initially double-contrast barium swallow was performed. The mucosa
of the esophagus is unremarkable. A single contrast study shows the
swallowing mechanism to be normal. No penetration or aspiration is
identified. Esophageal peristalsis appears normal. No hiatal hernia
is seen. However there is moderate gastroesophageal reflux
demonstrated with the water siphon maneuver. A barium pill was given
at the end the study which passed into the stomach without delay.
IMPRESSION: 1. Mild to moderate gastroesophageal reflux. Barium pill passes into
the stomach without delay.
2. No hiatal hernia is seen.

## 2020-09-11 ENCOUNTER — Other Ambulatory Visit: Payer: Self-pay

## 2020-09-11 ENCOUNTER — Ambulatory Visit (INDEPENDENT_AMBULATORY_CARE_PROVIDER_SITE_OTHER): Payer: 59 | Admitting: Podiatry

## 2020-09-11 ENCOUNTER — Ambulatory Visit (INDEPENDENT_AMBULATORY_CARE_PROVIDER_SITE_OTHER): Payer: 59

## 2020-09-11 DIAGNOSIS — Q666 Other congenital valgus deformities of feet: Secondary | ICD-10-CM

## 2020-09-11 DIAGNOSIS — M722 Plantar fascial fibromatosis: Secondary | ICD-10-CM | POA: Diagnosis not present

## 2020-09-11 DIAGNOSIS — M79671 Pain in right foot: Secondary | ICD-10-CM

## 2020-09-11 DIAGNOSIS — M79672 Pain in left foot: Secondary | ICD-10-CM

## 2020-09-11 MED ORDER — MELOXICAM 15 MG PO TABS: 15.0000 mg | ORAL_TABLET | Freq: Every day | ORAL | 0 refills | Status: DC

## 2020-09-11 MED ORDER — METHYLPREDNISOLONE 4 MG PO TBPK: ORAL_TABLET | ORAL | 0 refills | Status: DC

## 2020-09-11 NOTE — Progress Notes (Signed)
G

## 2020-09-15 ENCOUNTER — Encounter: Payer: Self-pay | Admitting: Podiatry

## 2020-09-15 NOTE — Progress Notes (Signed)
Subjective:  Patient ID: Brian Andrade, male    DOB: 15-Jun-1966,  MRN: [id number]  Chief Complaint  Patient presents with   Foot Pain    Pt states bilateral plantar heel and arch pain, 2 month duration, no known injuries, left worse than right.    54 y.o. male presents with the above complaint.  Patient presents with bilateral heel pain left worse than right side.  Patient states he has been on for quite some time like 2 months.  He does not have any injuries.  Patient states it hurts with taking the first step.  He would like to discuss treatment options for this.  He has not seen anyone else prior to seeing me.  He has not done any other conservative treatment options.  He is tried stretching a little bit but has not helped.  He denies any other acute complaints.   Review of Systems: Negative except as noted in the HPI. Denies N/V/F/Ch.  Past Medical History:  Diagnosis Date   Abdominal pain    lower left   Elevated cholesterol with elevated triglycerides    Family history of early CAD    GERD (gastroesophageal reflux disease)    Left inguinal hernia     Current Outpatient Medications:    Coenzyme Q10 100 MG capsule, Take by mouth. , Disp: , Rfl:    omeprazole (PRILOSEC) 20 MG capsule, Take 20 mg by mouth daily.  , Disp: , Rfl:    sertraline (ZOLOFT) 50 MG tablet, Take by mouth. , Disp: , Rfl:    aspirin 81 MG tablet, Take 81 mg by mouth daily. (Patient not taking: Reported on 09/11/2020), Disp: , Rfl:    glucosamine-chondroitin 500-400 MG tablet, Take 1 tablet by mouth 3 (three) times daily.   (Patient not taking: Reported on 09/11/2020), Disp: , Rfl:    ibuprofen (ADVIL,MOTRIN) 800 MG tablet, as needed. (Patient not taking: Reported on 09/11/2020), Disp: , Rfl:    meloxicam (MOBIC) 15 MG tablet, Take 1 tablet (15 mg total) by mouth daily., Disp: 30 tablet, Rfl: 0   metaxalone (SKELAXIN) 800 MG tablet, Take 1 tablet (800 mg total) by mouth 3 (three) times daily. As  muscle relaxer (Patient not taking: Reported on 01/16/2015), Disp: 21 tablet, Rfl: 0   methylPREDNISolone (MEDROL DOSEPAK) 4 MG TBPK tablet, Take as directed, Disp: 21 tablet, Rfl: 0   Multiple Vitamin (MULTIVITAMIN WITH MINERALS) TABS tablet, Take 1 tablet by mouth daily. (Patient not taking: Reported on 09/11/2020), Disp: , Rfl:    Multiple Vitamins-Minerals (MULTIVITAMIN PO), Take 1 tablet by mouth daily.   (Patient not taking: Reported on 09/11/2020), Disp: , Rfl:    Omega-3 Fatty Acids (FISH OIL) 600 MG CAPS, Take 4 tablets by mouth daily.   (Patient not taking: Reported on 09/11/2020), Disp: , Rfl:    pregabalin (LYRICA) 75 MG capsule, Take 1 capsule (75 mg total) by mouth 2 (two) times daily. (Patient not taking: Reported on 01/16/2015), Disp: 60 capsule, Rfl: 1   Red Yeast Rice 600 MG CAPS, Take 2 capsules by mouth daily.   (Patient not taking: Reported on 09/11/2020), Disp: , Rfl:    traMADol (ULTRAM) 50 MG tablet, Take 1 tablet (50 mg total) by mouth every 6 (six) hours as needed. (Patient not taking: Reported on 09/11/2020), Disp: 15 tablet, Rfl: 0   trimethoprim-polymyxin b (POLYTRIM) ophthalmic solution, Place 1 drop into the left eye every 4 (four) hours. For 5 days (Patient not taking: Reported on 09/11/2020), Disp:  10 mL, Rfl: 0   trimethoprim-polymyxin b (POLYTRIM) ophthalmic solution, Place 1 drop into the right eye every 4 (four) hours. (Patient not taking: Reported on 09/11/2020), Disp: 10 mL, Rfl: 0  Social History   Tobacco Use  Smoking Status Never Smoker  Smokeless Tobacco Former Neurosurgeon    Allergies  Allergen Reactions   Celebrex [Celecoxib] Other (See Comments)    Runs fever and "messes with his head". Causes dizziness   Objective:  There were no vitals filed for this visit. There is no height or weight on file to calculate BMI. Constitutional Well developed. Well nourished.  Vascular Dorsalis pedis pulses palpable bilaterally. Posterior tibial pulses palpable  bilaterally. Capillary refill normal to all digits.  No cyanosis or clubbing noted. Pedal hair growth normal.  Neurologic Normal speech. Oriented to person, place, and time. Epicritic sensation to light touch grossly present bilaterally.  Dermatologic Nails well groomed and normal in appearance. No open wounds. No skin lesions.  Orthopedic: Normal joint ROM without pain or crepitus bilaterally. No visible deformities. Tender to palpation at the calcaneal tuber bilaterally. No pain with calcaneal squeeze bilaterally. Ankle ROM diminished range of motion bilaterally. Silfverskiold Test: positive bilaterally.   Radiographs: Taken and reviewed. No acute fractures or dislocations. No evidence of stress fracture.  Plantar heel spur present. Posterior heel spur absent.   Assessment:   1. Plantar fasciitis of left foot   2. Plantar fasciitis of right foot    Plan:  Patient was evaluated and treated and all questions answered.  Plantar Fasciitis, bilaterally - XR reviewed as above.  - Educated on icing and stretching. Instructions given.  - Injection delivered to the plantar fascia as below. - DME: None - Pharmacologic management: Mobic and meloxicam  Procedure: Injection Tendon/Ligament Location: Bilateral plantar fascia at the glabrous junction; medial approach. Skin Prep: alcohol Injectate: 0.5 cc 0.5% marcaine plain, 0.5 cc of 1% Lidocaine, 0.5 cc kenalog 10. Disposition: Patient tolerated procedure well. Injection site dressed with a band-aid.  No follow-ups on file.

## 2020-10-09 ENCOUNTER — Encounter: Payer: Self-pay | Admitting: Podiatry

## 2020-10-09 ENCOUNTER — Other Ambulatory Visit: Payer: Self-pay

## 2020-10-09 ENCOUNTER — Ambulatory Visit (INDEPENDENT_AMBULATORY_CARE_PROVIDER_SITE_OTHER): Payer: 59 | Admitting: Podiatry

## 2020-10-09 DIAGNOSIS — Q666 Other congenital valgus deformities of feet: Secondary | ICD-10-CM

## 2020-10-09 DIAGNOSIS — M722 Plantar fascial fibromatosis: Secondary | ICD-10-CM | POA: Diagnosis not present

## 2020-10-13 ENCOUNTER — Encounter: Payer: Self-pay | Admitting: Podiatry

## 2020-10-13 NOTE — Progress Notes (Signed)
Subjective:  Patient ID: Brian Andrade, male    DOB: 1965-12-18,  MRN: [id number]  Chief Complaint  Patient presents with  . Follow-up    b/l plantar fasciitis f/u pt states left foot is still painful and right foot is okay.     54 y.o. male presents with the above complaint.  Patient presents with follow-up of bilateral plantar fasciitis.  The right side has completely of the left side there is residual pain.  He states that he has been ambulating with good shoe sneakers.  Patient would like to discuss orthotics as well.  He denies any other acute complaints.  He would like to know if there is another shot that needs to be done.  Review of Systems: Negative except as noted in the HPI. Denies N/V/F/Ch.  Past Medical History:  Diagnosis Date  . Abdominal pain    lower left  . Elevated cholesterol with elevated triglycerides   . Family history of early CAD   . GERD (gastroesophageal reflux disease)   . Left inguinal hernia     Current Outpatient Medications:  .  aspirin 81 MG tablet, Take 81 mg by mouth daily. (Patient not taking: Reported on 09/11/2020), Disp: , Rfl:  .  Coenzyme Q10 100 MG capsule, Take by mouth. , Disp: , Rfl:  .  glucosamine-chondroitin 500-400 MG tablet, Take 1 tablet by mouth 3 (three) times daily.   (Patient not taking: Reported on 09/11/2020), Disp: , Rfl:  .  ibuprofen (ADVIL,MOTRIN) 800 MG tablet, as needed. (Patient not taking: Reported on 09/11/2020), Disp: , Rfl:  .  meloxicam (MOBIC) 15 MG tablet, Take 1 tablet (15 mg total) by mouth daily., Disp: 30 tablet, Rfl: 0 .  metaxalone (SKELAXIN) 800 MG tablet, Take 1 tablet (800 mg total) by mouth 3 (three) times daily. As muscle relaxer (Patient not taking: Reported on 01/16/2015), Disp: 21 tablet, Rfl: 0 .  methylPREDNISolone (MEDROL DOSEPAK) 4 MG TBPK tablet, Take as directed, Disp: 21 tablet, Rfl: 0 .  Multiple Vitamin (MULTIVITAMIN WITH MINERALS) TABS tablet, Take 1 tablet by mouth daily. (Patient not taking:  Reported on 09/11/2020), Disp: , Rfl:  .  Multiple Vitamins-Minerals (MULTIVITAMIN PO), Take 1 tablet by mouth daily.   (Patient not taking: Reported on 09/11/2020), Disp: , Rfl:  .  Omega-3 Fatty Acids (FISH OIL) 600 MG CAPS, Take 4 tablets by mouth daily.   (Patient not taking: Reported on 09/11/2020), Disp: , Rfl:  .  omeprazole (PRILOSEC) 20 MG capsule, Take 20 mg by mouth daily.  , Disp: , Rfl:  .  pregabalin (LYRICA) 75 MG capsule, Take 1 capsule (75 mg total) by mouth 2 (two) times daily. (Patient not taking: Reported on 01/16/2015), Disp: 60 capsule, Rfl: 1 .  Red Yeast Rice 600 MG CAPS, Take 2 capsules by mouth daily.   (Patient not taking: Reported on 09/11/2020), Disp: , Rfl:  .  sertraline (ZOLOFT) 50 MG tablet, Take by mouth. , Disp: , Rfl:  .  traMADol (ULTRAM) 50 MG tablet, Take 1 tablet (50 mg total) by mouth every 6 (six) hours as needed. (Patient not taking: Reported on 09/11/2020), Disp: 15 tablet, Rfl: 0 .  trimethoprim-polymyxin b (POLYTRIM) ophthalmic solution, Place 1 drop into the left eye every 4 (four) hours. For 5 days (Patient not taking: Reported on 09/11/2020), Disp: 10 mL, Rfl: 0 .  trimethoprim-polymyxin b (POLYTRIM) ophthalmic solution, Place 1 drop into the right eye every 4 (four) hours. (Patient not taking: Reported on 09/11/2020), Disp:  10 mL, Rfl: 0  Social History   Tobacco Use  Smoking Status Never Smoker  Smokeless Tobacco Former Neurosurgeon    Allergies  Allergen Reactions  . Celebrex [Celecoxib] Other (See Comments)    Runs fever and "messes with his head". Causes dizziness   Objective:  There were no vitals filed for this visit. There is no height or weight on file to calculate BMI. Constitutional Well developed. Well nourished.  Vascular Dorsalis pedis pulses palpable bilaterally. Posterior tibial pulses palpable bilaterally. Capillary refill normal to all digits.  No cyanosis or clubbing noted. Pedal hair growth normal.  Neurologic Normal  speech. Oriented to person, place, and time. Epicritic sensation to light touch grossly present bilaterally.  Dermatologic Nails well groomed and normal in appearance. No open wounds. No skin lesions.  Orthopedic: Normal joint ROM without pain or crepitus bilaterally. No visible deformities. Tender to palpation at the calcaneal tuber left No pain with calcaneal squeeze bilaterally. Ankle ROM diminished range of motion bilaterally. Silfverskiold Test: positive bilaterally.   Radiographs: Taken and reviewed. No acute fractures or dislocations. No evidence of stress fracture.  Plantar heel spur present. Posterior heel spur absent.   Assessment:   1. Plantar fasciitis of left foot   2. Pes planovalgus    Plan:  Patient was evaluated and treated and all questions answered.  Right plantar fasciitis -Clinically healed  Pes planovalgus -I explained to the patient the etiology of pes planovalgus and various treatment options were discussed.  Given that he still having pain in the left plantar fascia I believe he will benefit from custom-made orthotics to help control the hindfoot motion and support the arch of the foot and take the stress away from plantar fascia.  Patient agrees with plan would like to get orthotics -He will be scheduled see rec for custom-made orthotics  Plantar Fasciitis, left - XR reviewed as above.  - Educated on icing and stretching. Instructions given.  -Second injection delivered to the plantar fascia as below. - DME: None - Pharmacologic management: Mobic and meloxicam  Procedure: Injection Tendon/Ligament Location: Bilateral plantar fascia at the glabrous junction; medial approach. Skin Prep: alcohol Injectate: 0.5 cc 0.5% marcaine plain, 0.5 cc of 1% Lidocaine, 0.5 cc kenalog 10. Disposition: Patient tolerated procedure well. Injection site dressed with a band-aid.  No follow-ups on file.

## 2020-10-16 ENCOUNTER — Other Ambulatory Visit: Payer: Self-pay

## 2020-10-16 ENCOUNTER — Ambulatory Visit: Payer: 59 | Admitting: Orthotics

## 2020-10-16 DIAGNOSIS — Q666 Other congenital valgus deformities of feet: Secondary | ICD-10-CM

## 2020-10-16 DIAGNOSIS — M722 Plantar fascial fibromatosis: Secondary | ICD-10-CM

## 2020-11-11 ENCOUNTER — Ambulatory Visit: Payer: 59 | Admitting: Podiatry

## 2020-11-19 ENCOUNTER — Other Ambulatory Visit: Payer: Self-pay | Admitting: Podiatry

## 2020-11-19 DIAGNOSIS — Q666 Other congenital valgus deformities of feet: Secondary | ICD-10-CM

## 2020-11-24 ENCOUNTER — Ambulatory Visit: Payer: 59 | Admitting: Orthotics

## 2020-11-24 ENCOUNTER — Other Ambulatory Visit: Payer: Self-pay

## 2020-11-24 DIAGNOSIS — M722 Plantar fascial fibromatosis: Secondary | ICD-10-CM

## 2020-11-24 DIAGNOSIS — Q666 Other congenital valgus deformities of feet: Secondary | ICD-10-CM

## 2020-11-26 NOTE — Progress Notes (Signed)
Patient picked up f/o and was pleased with fit, comfort, and function.  Worked well with footwear.  Told of rbeak in period and how to report any issues.  

## 2020-12-27 ENCOUNTER — Ambulatory Visit: Payer: Self-pay

## 2021-01-11 ENCOUNTER — Other Ambulatory Visit: Payer: Self-pay

## 2021-01-11 ENCOUNTER — Ambulatory Visit (INDEPENDENT_AMBULATORY_CARE_PROVIDER_SITE_OTHER): Payer: 59 | Admitting: Podiatry

## 2021-01-11 DIAGNOSIS — R1013 Epigastric pain: Secondary | ICD-10-CM | POA: Insufficient documentation

## 2021-01-11 DIAGNOSIS — Z1211 Encounter for screening for malignant neoplasm of colon: Secondary | ICD-10-CM | POA: Insufficient documentation

## 2021-01-11 DIAGNOSIS — Q666 Other congenital valgus deformities of feet: Secondary | ICD-10-CM

## 2021-01-11 DIAGNOSIS — M722 Plantar fascial fibromatosis: Secondary | ICD-10-CM

## 2021-01-11 DIAGNOSIS — E559 Vitamin D deficiency, unspecified: Secondary | ICD-10-CM | POA: Insufficient documentation

## 2021-01-11 DIAGNOSIS — E782 Mixed hyperlipidemia: Secondary | ICD-10-CM | POA: Insufficient documentation

## 2021-01-11 DIAGNOSIS — R110 Nausea: Secondary | ICD-10-CM | POA: Insufficient documentation

## 2021-01-11 NOTE — Progress Notes (Signed)
Patient brought his inserts back in for adjustment. Patient stated that he felt like they did not have enough support in the arch area and has only had them a month.    Patient stated that he felt the inserts were not high enough in the arch area.  I asked the patient to take the inserts out of the shoes and put them on the floor and told the patient to step on them and the patient's feet hung over the instep just enough that it would not be comfortable.  I stated to the patient that I would get with Dr Allena Katz and that we would send them back for an adjustment and I would call the patient when the inserts came back.

## 2021-04-22 NOTE — Progress Notes (Signed)
Pt was seen in our office today with EJ from [facility] to be casted for CMOS. He will be called when they arrive.

## 2021-12-14 ENCOUNTER — Other Ambulatory Visit (HOSPITAL_BASED_OUTPATIENT_CLINIC_OR_DEPARTMENT_OTHER): Payer: Self-pay | Admitting: *Deleted

## 2021-12-14 ENCOUNTER — Ambulatory Visit (HOSPITAL_BASED_OUTPATIENT_CLINIC_OR_DEPARTMENT_OTHER): Payer: Managed Care, Other (non HMO) | Admitting: Internal Medicine

## 2021-12-14 ENCOUNTER — Other Ambulatory Visit: Payer: Self-pay

## 2021-12-14 ENCOUNTER — Encounter (HOSPITAL_BASED_OUTPATIENT_CLINIC_OR_DEPARTMENT_OTHER): Payer: Self-pay | Admitting: Internal Medicine

## 2021-12-14 ENCOUNTER — Other Ambulatory Visit (HOSPITAL_BASED_OUTPATIENT_CLINIC_OR_DEPARTMENT_OTHER): Payer: Self-pay | Admitting: Internal Medicine

## 2021-12-14 VITALS — BP 122/80 | HR 67 | Ht 75.0 in | Wt 253.6 lb

## 2021-12-14 DIAGNOSIS — E782 Mixed hyperlipidemia: Secondary | ICD-10-CM

## 2021-12-14 DIAGNOSIS — T466X5A Adverse effect of antihyperlipidemic and antiarteriosclerotic drugs, initial encounter: Secondary | ICD-10-CM | POA: Diagnosis not present

## 2021-12-14 DIAGNOSIS — E7801 Familial hypercholesterolemia: Secondary | ICD-10-CM | POA: Diagnosis not present

## 2021-12-14 DIAGNOSIS — M791 Myalgia, unspecified site: Secondary | ICD-10-CM | POA: Diagnosis not present

## 2021-12-14 DIAGNOSIS — Z8249 Family history of ischemic heart disease and other diseases of the circulatory system: Secondary | ICD-10-CM

## 2021-12-14 MED ORDER — PRALUENT 150 MG/ML ~~LOC~~ SOAJ: 150.0000 [IU] | SUBCUTANEOUS | 3 refills | Status: DC

## 2021-12-14 NOTE — Patient Instructions (Signed)
Medication Instructions:  Your physician has recommended you make the following change in your medication:  START Praluent injections.  You will inject 150 units every 14 days.  *If you need a refill on your cardiac medications before your next appointment, please call your pharmacy*   Lab Work: 3 MONTHS:  FASTING LIPID & LPA (you will come to the same location, lab is on the 3rd floor)  If you have labs (blood work) drawn today and your tests are completely normal, you will receive your results only by: MyChart Message (if you have MyChart) OR A paper copy in the mail If you have any lab test that is abnormal or we need to change your treatment, we will call you to review the results.   Testing/Procedures: None ordered   Follow-Up: At [facility], you and your health needs are our priority.  As part of our continuing mission to provide you with exceptional heart care, we have created designated Provider Care Teams.  These Care Teams include your primary Cardiologist (physician) and Advanced Practice Providers (APPs -  Physician Assistants and Nurse Practitioners) who all work together to provide you with the care you need, when you need it.  We recommend signing up for the patient portal called "MyChart".  Sign up information is provided on this After Visit Summary.  MyChart is used to connect with patients for Virtual Visits (Telemedicine).  Patients are able to view lab/test results, encounter notes, upcoming appointments, etc.  Non-urgent messages can be sent to your provider as well.   To learn more about what you can do with MyChart, go to [email or web address].    Your next appointment:   3 month(s)  The format for your next appointment:   In Person  Provider:   K. Italy Hilty, MD    Other Instructions

## 2021-12-14 NOTE — Progress Notes (Signed)
LIPID CLINIC CONSULT NOTE  Chief Complaint:  Manage dyslipidemia  Primary Care Physician: Brian Joe, MD  Primary Cardiologist:  None  HPI:  Brian Andrade is a 56 y.o. male who is being seen today for the evaluation of dyslipidemia at the request of Brian Andrade, [place].  This is a pleasant 56 year old male kindly referred for evaluation management of dyslipidemia.  He has a past medical history significant for dyslipidemia but has been statin intolerant having tried Crestor and Lipitor at varying doses which caused some myalgias.  His last LDL was 191 on once weekly statin.  There is a family history of heart disease including his father who had coronary disease and a stent at age 69.  Paternal grandfather also had cardiovascular disease including prior stents and stroke.  His most recent lipid profile showed total cholesterol 263, triglycerides 207, HDL 44 and LDL 180.  His PCP attempted to prescribe Repatha however insurance required specialist evaluation.  PMHx:  Past Medical History:  Diagnosis Date   Abdominal pain    lower left   Elevated cholesterol with elevated triglycerides    Family history of early CAD    GERD (gastroesophageal reflux disease)    Left inguinal hernia     Past Surgical History:  Procedure Laterality Date   HERNIA REPAIR  1975, 2010    FAMHx:  Family History  Problem Relation Age of Onset   Hyperlipidemia Mother    Cancer Mother        breast   Heart disease Father    Hyperlipidemia Father    Hyperlipidemia Paternal Grandmother    Hypertension Paternal Grandmother    Diabetes Paternal Grandmother    Cancer Maternal Grandfather        lung    SOCHx:   reports that he has never smoked. He has quit using smokeless tobacco. He reports that he does not drink alcohol and does not use drugs.  ALLERGIES:  Allergies  Allergen Reactions   Celebrex [Celecoxib] Other (See Comments)    Runs fever and "messes with his head". Causes dizziness    Crestor [Rosuvastatin]     ROS: Pertinent items noted in HPI and remainder of comprehensive ROS otherwise negative.  HOME MEDS: Current Outpatient Medications on File Prior to Visit  Medication Sig Dispense Refill   Evolocumab (REPATHA SURECLICK) 140 MG/ML SOAJ See admin instructions.     amoxicillin-clavulanate (AUGMENTIN) 875-125 MG tablet 1 tablet     aspirin 81 MG tablet Take 81 mg by mouth daily. (Patient not taking: Reported on 09/11/2020)     atorvastatin (LIPITOR) 10 MG tablet TAKE 1 TABLET ORALLY EVERY OTHER DAY 90 DAYS     Coenzyme Q10 100 MG capsule Take by mouth.      esomeprazole (NEXIUM) 40 MG capsule 1 capsule     glucosamine-chondroitin 500-400 MG tablet Take 1 tablet by mouth 3 (three) times daily.   (Patient not taking: Reported on 09/11/2020)     ibuprofen (ADVIL,MOTRIN) 800 MG tablet as needed. (Patient not taking: Reported on 09/11/2020)     meloxicam (MOBIC) 15 MG tablet Take 1 tablet (15 mg total) by mouth daily. 30 tablet 0   metaxalone (SKELAXIN) 800 MG tablet Take 1 tablet (800 mg total) by mouth 3 (three) times daily. As muscle relaxer (Patient not taking: Reported on 01/16/2015) 21 tablet 0   methylPREDNISolone (MEDROL DOSEPAK) 4 MG TBPK tablet Take as directed 21 tablet 0   Multiple Vitamin (MULTIVITAMIN WITH MINERALS) TABS tablet  Take 1 tablet by mouth daily. (Patient not taking: Reported on 09/11/2020)     Omega-3 Fatty Acids (FISH OIL) 600 MG CAPS Take 4 tablets by mouth daily.   (Patient not taking: Reported on 09/11/2020)     omeprazole (PRILOSEC) 20 MG capsule Take 20 mg by mouth daily.       pregabalin (LYRICA) 75 MG capsule Take 1 capsule (75 mg total) by mouth 2 (two) times daily. (Patient not taking: Reported on 01/16/2015) 60 capsule 1   Red Yeast Rice 600 MG CAPS Take 2 capsules by mouth daily.   (Patient not taking: Reported on 09/11/2020)     sertraline (ZOLOFT) 50 MG tablet Take by mouth.      traMADol (ULTRAM) 50 MG tablet Take 1 tablet (50 mg total) by  mouth every 6 (six) hours as needed. (Patient not taking: Reported on 09/11/2020) 15 tablet 0   trimethoprim-polymyxin b (POLYTRIM) ophthalmic solution Place 1 drop into the left eye every 4 (four) hours. For 5 days (Patient not taking: Reported on 09/11/2020) 10 mL 0   trimethoprim-polymyxin b (POLYTRIM) ophthalmic solution Place 1 drop into the right eye every 4 (four) hours. (Patient not taking: Reported on 09/11/2020) 10 mL 0   No current facility-administered medications on file prior to visit.    LABS/IMAGING: No results found for this or any previous visit (from the past 48 hour(s)). No results found.  LIPID PANEL: Lipid Panel w/reflex   2021-06-24    Cholesterol 263   <200  CHOL/HDL 6.0   2.0-4.0  HDLD 44   30-70  Triglyceride 207   0-199  NHDL 219   0-129  LDL Chol Calc (NIH) 180   6-96  LDL Chol Calc (NIH) 180   7-89   WEIGHTS: Wt Readings from Last 3 Encounters:  12/14/21 253 lb 9.6 oz (115 kg)  12/23/13 247 lb 12.8 oz (112.4 kg)  11/02/11 245 lb 6.4 oz (111.3 kg)    VITALS: Ht 6\' 3"  (1.905 m)    Wt 253 lb 9.6 oz (115 kg)    BMI 31.70 kg/m   EXAM: General appearance: alert and no distress Neck: no carotid bruit, no JVD, and thyroid not enlarged, symmetric, no tenderness/mass/nodules Lungs: clear to auscultation bilaterally Heart: regular rate and rhythm, S1, S2 normal, no murmur, click, rub or gallop Abdomen: soft, non-tender; bowel sounds normal; no masses,  no organomegaly Extremities: extremities normal, atraumatic, no cyanosis or edema Pulses: 2+ and symmetric Skin: Skin color, texture, turgor normal. No rashes or lesions Neurologic: Grossly normal Psych: Pleasant  EKG: N/A  ASSESSMENT: Probable familial hyperlipidemia, LDL greater than 190 Family history of premature coronary disease Statin intolerant-myalgias  PLAN: 1.   Mr. Brian Andrade has a probable familial hyperlipidemia with untreated LDL cholesterol greater than 190 and family history of premature  coronary disease in first-degree relatives.  His father also had high cholesterol.  Unfortunately has been intolerant to both rosuvastatin and atorvastatin causing myalgias.  He is a good candidate for PCSK9 inhibitor and will pursue that -his insurance plan would prefer Praluent, start 150 mg every 2 weeks.  Plan repeat lipids in about 3 months and follow-up with me afterwards.  Thanks again for the kind referral  Brooke Dare, MD, Chrystie Nose  [place]   [facility]  Medical Director of the Advanced Lipid Disorders &  Cardiovascular Risk Reduction Clinic Diplomate of the American Board of Clinical Lipidology Attending Cardiologist  Direct Dial: [phone]   Fax: [phone]  Website:  [email or web address] 12/14/2021, 8:36 AM

## 2021-12-16 ENCOUNTER — Telehealth: Payer: Self-pay | Admitting: Internal Medicine

## 2021-12-16 NOTE — Telephone Encounter (Signed)
PA for Repatha [employer] submitted via CMM  (Key: [id number])  Upon attempting Praluent PA, the last question stated Repatha was the preferred product.   Will updated patient/pharmacy once determination received

## 2021-12-22 ENCOUNTER — Telehealth: Payer: Self-pay | Admitting: Internal Medicine

## 2021-12-22 NOTE — Telephone Encounter (Signed)
Returned the call to the patient's wife. She stated that the patient was unable to get the praluent due to a section of the form not being filled out. She became upset and said that the patient needs the injection now and this has been going on for too long. She complained that they had to wait to be seen and now has to wait for the medication.  She was advised that this does take time and it is a process and that in some cases we have to wait on insurance to contact us back. She has been advised that she will get a call back when the nurse is back in the office.

## 2021-12-22 NOTE — Telephone Encounter (Signed)
Follow Up:    Wife called and said patient still have not been able to get his Injection. The pharmacist said there was a section on the form that was filled out by the doctorN, She wants to know if the form was returned and have it been completed? She says the patient needs his medicine asap please.e

## 2021-12-27 ENCOUNTER — Telehealth: Payer: Self-pay | Admitting: Internal Medicine

## 2021-12-27 NOTE — Telephone Encounter (Signed)
Pts wife reaching back out for an update on Praluent PA... please advise

## 2021-12-27 NOTE — Telephone Encounter (Signed)
Spoke to wife . Wife very concern  it has taken so long for  medication to be prescribed to patient . She states it has been almost a month and still patient has not received medication. She also stated it took the patient 4 months to get the initial visit to see Dr Debara Pickett ( Oct 2022 to Feb 2023)    She state this is not acceptable.  RN informed Mrs Westermann that the nurse was out other office  last week but will be back tomorrow.  The information let is that insurance will not be covered but Repatha maybe covered and she a was awaiting more information.

## 2021-12-27 NOTE — Telephone Encounter (Signed)
Please advise 

## 2021-12-28 NOTE — Telephone Encounter (Signed)
When PA was initiated in [facility], the questions indicated that Repatha was preferred.   Spent >60 mins on the phone with various folks with [place] insurance to only find out that PA request is still under "clinical review" which can take 5-10 business days but "they are backlogged"

## 2021-12-28 NOTE — Telephone Encounter (Signed)
Patient's wife returned called. Explained that her that PA for Praluent was not submitted as it is NOT preferred with his plan, per attempt made at a PA in [facility] on 12/16/21. Explained that Repatha is preferred and PA was submitted on 12/16/21 with MD note. Informed her that I called [employer] and spent over 1 hour on the phone to obtain an update on status.   She is upset that patient waited 6 months for lipid appt and then was told it would be a few days before he could get the medication.   Explained to her that typically PA responses do not typically take this long, but Rosann Auerbach said they are backlogged.   Informed her that I will update when when I have a response.

## 2021-12-28 NOTE — Telephone Encounter (Signed)
Spoke with [facility] pharmacy rep and provided update that praluent should be taken off his profile as not preferred and PA for repatha has been submitted, waiting on response

## 2021-12-28 NOTE — Telephone Encounter (Signed)
Was told that Praluent was preferred, but [place] did PA for Repatha since the authorization stated that Repatha was actually preferred. Eileen Stanford can you check on the PA today if you are in the office?  Thanks.

## 2021-12-28 NOTE — Telephone Encounter (Signed)
Left message for wife to call back to provide her an update

## 2021-12-28 NOTE — Telephone Encounter (Signed)
No form received. Spent >60 minutes on phone/on hold with various facets of his insurance to obtain update. PA for repatha is still under clinical review. LM for wife to call back to update her  This encounter will be closed as there are other open encounters regarding same subject matter

## 2021-12-28 NOTE — Telephone Encounter (Signed)
Spoke with [employer] rep (over 60 mins on the phone) and was informed that Repatha PA is under clinical review, typically takes 5-10 business days, but they are "backlogged"  Informed wife of this Will call her with outcome once received

## 2021-12-28 NOTE — Telephone Encounter (Signed)
Patient's wife returning call. 

## 2021-12-29 MED ORDER — REPATHA SURECLICK 140 MG/ML ~~LOC~~ SOAJ: 1.0000 | SUBCUTANEOUS | 3 refills | Status: DC

## 2021-12-29 NOTE — Telephone Encounter (Addendum)
Spoke with patient's wife and relayed message that fax was received with medication approval. She states she talked with [place] yesterday and they told her a different story than what I relayed, about all info being submitted on 2/9 (see 2/15 and 2/20 phone notes). She stated it is all OK now, he has the medication approval. Advised her Rx will be sent to [facility].

## 2021-12-29 NOTE — Telephone Encounter (Signed)
Received fax from [place] that [place] is approved until 12/28/2022

## 2021-12-29 NOTE — Addendum Note (Signed)
Addended by: Fidel Levy on: 12/29/2021 09:21 AM   Modules accepted: Orders

## 2021-12-30 ENCOUNTER — Telehealth: Payer: Self-pay | Admitting: Podiatry

## 2021-12-30 NOTE — Telephone Encounter (Signed)
Lvm for patient to call back and schedule an appointment with our offce

## 2021-12-31 ENCOUNTER — Telehealth: Payer: Self-pay | Admitting: Internal Medicine

## 2021-12-31 NOTE — Telephone Encounter (Signed)
Pt c/o medication issue:  1. Name of Medication:   Evolocumab (REPATHA SURECLICK) 140 MG/ML SOAJ   2. How are you currently taking this medication (dosage and times per day)? Not currently taking medication   3. Are you having a reaction (difficulty breathing--STAT)? No   4. What is your medication issue? Wife is calling stating this medication will cost $450 a month with insurance. Is wanting to know if there is an alternative medication that can be prescribed instead due to this.

## 2022-01-05 NOTE — Telephone Encounter (Signed)
Late entry -  ? ?01/04/2022 attempted to provide Repatha copay card info to pharmacy but card is not activated. Attempted to activate online but needed email address which I did not have on file ? ?01/05/2022 - called wife and provided her with copay card info and advised to enroll on [email or web address] ? ?She was appreciative of call.  ? ?Copay card ?ID: [id number] ?RxBIN: [id number] ?RxPCN: [id number] ?RxGRP: [id number] ?

## 2022-04-11 ENCOUNTER — Telehealth: Payer: Self-pay

## 2022-04-11 ENCOUNTER — Telehealth: Payer: Self-pay | Admitting: Internal Medicine

## 2022-04-11 NOTE — Telephone Encounter (Signed)
Pt c/o medication issue:  1. Name of Medication: Evolocumab (REPATHA SURECLICK) 140 MG/ML SOAJ  2. How are you currently taking this medication (dosage and times per day)? As directed.   3. Are you having a reaction (difficulty breathing--STAT)? no  4. What is your medication issue? Cost  Wife of the patient said medication went from $5 per fill to $348 per fill because the patient is in the donut hole. The wife wanted to know if Dr. Rennis Golden could prescribe a more affordable medication

## 2022-04-11 NOTE — Telephone Encounter (Signed)
Duplicate message concerning same subject. Will close this encounter.

## 2022-04-11 NOTE — Telephone Encounter (Signed)
Looks like he should have a copay card activated?

## 2022-04-11 NOTE — Telephone Encounter (Signed)
Spoke with pharmacist at [facility] covers med at [phone] 1st fill was $5 but then after that co-pay card only deducts $200 off per fill - thus leaving $348  Spoke with drug rep and she said patient likely has high deductible. She is checking on what is best course of action and will call back   Spoke with patient's wife and explained situation. Advised will check on Repatha samples and call her tomorrow AM as he was due for injection this past weekend.   Awaiting update from drug rep

## 2022-04-12 NOTE — Telephone Encounter (Signed)
Repatha sample x1 left for pick up  Wife notified sample ready for pick up

## 2022-04-13 NOTE — Telephone Encounter (Signed)
Patient's wife notified of advice received from rep. She will call Repatha Ready for assistance

## 2022-04-13 NOTE — Telephone Encounter (Signed)
Spoke with drug rep - patient needs to "enroll" in the co-pay program, not just activate the card.  This should allow him to get up to 6 months for $5 month  Repatha Ready  [id number]  Would need patient to inquire about a participating pharmacy

## 2022-04-22 ENCOUNTER — Telehealth (HOSPITAL_BASED_OUTPATIENT_CLINIC_OR_DEPARTMENT_OTHER): Payer: Self-pay | Admitting: Internal Medicine

## 2022-04-22 LAB — NMR, LIPOPROFILE
Cholesterol, Total: 125 mg/dL (ref 100–199)
HDL Particle Number: 30.4 umol/L — ABNORMAL LOW (ref >=30.5)
HDL-C: 38 mg/dL — ABNORMAL LOW (ref >39)
LDL Particle Number: 778 nmol/L (ref <1000)
LDL Size: 20.4 nm — ABNORMAL LOW (ref >20.5)
LDL-C (NIH Calc): 68 mg/dL (ref 0–99)
LP-IR Score: 56 — ABNORMAL HIGH (ref <=45)
Small LDL Particle Number: 396 nmol/L (ref <=527)
Triglycerides: 101 mg/dL (ref 0–149)

## 2022-04-22 LAB — HEPATIC FUNCTION PANEL
ALT: 27 IU/L (ref 0–44)
AST: 24 IU/L (ref 0–40)
Albumin: 4.5 g/dL (ref 3.8–4.9)
Alkaline Phosphatase: 84 IU/L (ref 44–121)
Bilirubin Total: 0.5 mg/dL (ref 0.0–1.2)
Bilirubin, Direct: 0.17 mg/dL (ref 0.00–0.40)
Total Protein: 6.7 g/dL (ref 6.0–8.5)

## 2022-04-22 NOTE — Telephone Encounter (Signed)
Spoke with wife. She is asking if he may get another Repatha sample at his 6/20 visit if things are not worked out with [employer], so that he can get med for $5 copay. Advised will check on samples for that appointment.

## 2022-04-22 NOTE — Telephone Encounter (Signed)
Patients wife while confirming this appt said she would like to know if Dr. Rennis Golden can prescribe more Rapatha Ready before the appt on 6/20.

## 2022-04-26 ENCOUNTER — Ambulatory Visit (HOSPITAL_BASED_OUTPATIENT_CLINIC_OR_DEPARTMENT_OTHER): Payer: Commercial Managed Care - HMO | Admitting: Internal Medicine

## 2022-04-26 ENCOUNTER — Encounter (HOSPITAL_BASED_OUTPATIENT_CLINIC_OR_DEPARTMENT_OTHER): Payer: Self-pay | Admitting: Internal Medicine

## 2022-04-26 VITALS — BP 108/72 | HR 63 | Ht 75.0 in | Wt 248.3 lb

## 2022-04-26 DIAGNOSIS — T466X5D Adverse effect of antihyperlipidemic and antiarteriosclerotic drugs, subsequent encounter: Secondary | ICD-10-CM | POA: Diagnosis not present

## 2022-04-26 DIAGNOSIS — M791 Myalgia, unspecified site: Secondary | ICD-10-CM

## 2022-04-26 DIAGNOSIS — T466X5A Adverse effect of antihyperlipidemic and antiarteriosclerotic drugs, initial encounter: Secondary | ICD-10-CM

## 2022-04-26 DIAGNOSIS — E7801 Familial hypercholesterolemia: Secondary | ICD-10-CM | POA: Diagnosis not present

## 2022-04-26 DIAGNOSIS — E782 Mixed hyperlipidemia: Secondary | ICD-10-CM | POA: Diagnosis not present

## 2022-04-26 NOTE — Progress Notes (Signed)
LIPID CLINIC CONSULT NOTE  Chief Complaint:  Follow-up dyslipidemia  Primary Care Physician: Tally Joe, MD  Primary Cardiologist:  None  HPI:  Brian Andrade is a 56 y.o. male who is being seen today for the evaluation of dyslipidemia at the request of Tally Joe, MD.  This is a pleasant 56 year old male kindly referred for evaluation management of dyslipidemia.  He has a past medical history significant for dyslipidemia but has been statin intolerant having tried Crestor and Lipitor at varying doses which caused some myalgias.  His last LDL was 191 on once weekly statin.  There is a family history of heart disease including his father who had coronary disease and a stent at age 23.  Paternal grandfather also had cardiovascular disease including prior stents and stroke.  His most recent lipid profile showed total cholesterol 263, triglycerides 207, HDL 44 and LDL 180.  His PCP attempted to prescribe Repatha however insurance required specialist evaluation.  04/26/2022  Brian Andrade returns today for follow-up.  He is done very well on Repatha.  He denies any side effects or muscle aches that he previously had on the statins.  In addition his cholesterol has come down significantly.  LDL particle number now 778, LDL-C of 66, HDL-C of 38 and triglycerides of 101.  Small LDL particle number of 396.  This represents a significant improvement in his lipids and now he is at target.  Overall he is happy with the medication although cost has recently increased.  We have reached out to the company to see about options to try to reduce the cost.  He mentioned he would like a sample today if possible.  PMHx:  Past Medical History:  Diagnosis Date   Abdominal pain    lower left   Elevated cholesterol with elevated triglycerides    Family history of early CAD    GERD (gastroesophageal reflux disease)    Left inguinal hernia     Past Surgical History:  Procedure Laterality Date   HERNIA  REPAIR  1975, 2010    FAMHx:  Family History  Problem Relation Age of Onset   Hyperlipidemia Mother    Cancer Mother        breast   Heart disease Father    Hyperlipidemia Father    Hyperlipidemia Paternal Grandmother    Hypertension Paternal Grandmother    Diabetes Paternal Grandmother    Cancer Maternal Grandfather        lung    SOCHx:   reports that he has never smoked. He has quit using smokeless tobacco. He reports that he does not drink alcohol and does not use drugs.  ALLERGIES:  Allergies  Allergen Reactions   Celebrex [Celecoxib] Other (See Comments)    Runs fever and "messes with his head". Causes dizziness   Crestor [Rosuvastatin]     ROS: Pertinent items noted in HPI and remainder of comprehensive ROS otherwise negative.  HOME MEDS: Current Outpatient Medications on File Prior to Visit  Medication Sig Dispense Refill   aspirin 81 MG tablet Take 81 mg by mouth daily.     Coenzyme Q10 100 MG capsule Take by mouth.      esomeprazole (NEXIUM) 40 MG capsule 1 capsule     Evolocumab (REPATHA SURECLICK) 140 MG/ML SOAJ Inject 1 Dose into the skin every 14 (fourteen) days. 6 mL 3   glucosamine-chondroitin 500-400 MG tablet Take 1 tablet by mouth 3 (three) times daily.     ibuprofen (ADVIL,MOTRIN) 800  MG tablet as needed.     Multiple Vitamin (MULTIVITAMIN WITH MINERALS) TABS tablet Take 1 tablet by mouth daily.     Omega-3 Fatty Acids (FISH OIL) 600 MG CAPS Take 4 tablets by mouth daily.     sertraline (ZOLOFT) 50 MG tablet Take by mouth.      No current facility-administered medications on file prior to visit.    LABS/IMAGING: No results found for this or any previous visit (from the past 48 hour(s)). No results found.  LIPID PANEL: Lipid Panel w/reflex   2021-06-24    Cholesterol 263   <200  CHOL/HDL 6.0   2.0-4.0  HDLD 44   30-70  Triglyceride 207   0-199  NHDL 219   0-129  LDL Chol Calc (NIH) 180   4-26  LDL Chol Calc (NIH) 180   8-34    WEIGHTS: Wt Readings from Last 3 Encounters:  04/26/22 248 lb 4.8 oz (112.6 kg)  12/14/21 253 lb 9.6 oz (115 kg)  12/23/13 247 lb 12.8 oz (112.4 kg)    VITALS: BP 108/72   Pulse 63   Ht 6\' 3"  (1.905 m)   Wt 248 lb 4.8 oz (112.6 kg)   SpO2 95%   BMI 31.04 kg/m   EXAM: Deferred  EKG: N/A  ASSESSMENT: Probable familial hyperlipidemia, LDL greater than 190 Family history of premature coronary disease Statin intolerant-myalgias  PLAN: 1.   Brian Andrade has had a significant improvement in his lipids on current therapy.  He is tolerating well without any side effects.  I will continue on this therapy with a plan to repeat a lipid NMR in about a year and follow-up with me at that time.  Brooke Dare, MD, [facility], FACP  [place]  [facility]  Medical Director of the Advanced Lipid Disorders &  Cardiovascular Risk Reduction Clinic Diplomate of the American Board of Clinical Lipidology Attending Cardiologist  Direct Dial: [phone]  Fax: [phone]  Website:  [email or web address] Trevon Strothers 04/26/2022, 11:13 AM

## 2022-04-26 NOTE — Patient Instructions (Signed)
Medication Instructions:  Your physician recommends that you continue on your current medications as directed. Please refer to the Current Medication list given to you today.  *If you need a refill on your cardiac medications before your next appointment, please call your pharmacy*   Lab Work: FASTING lab work to check cholesterol -- complete about 1 week before next appointment   If you have labs (blood work) drawn today and your tests are completely normal, you will receive your results only by: MyChart Message (if you have MyChart) OR A paper copy in the mail If you have any lab test that is abnormal or we need to change your treatment, we will call you to review the results.   Follow-Up: At [facility], you and your health needs are our priority.  As part of our continuing mission to provide you with exceptional heart care, we have created designated Provider Care Teams.  These Care Teams include your primary Cardiologist (physician) and Advanced Practice Providers (APPs -  Physician Assistants and Nurse Practitioners) who all work together to provide you with the care you need, when you need it.  We recommend signing up for the patient portal called "MyChart".  Sign up information is provided on this After Visit Summary.  MyChart is used to connect with patients for Virtual Visits (Telemedicine).  Patients are able to view lab/test results, encounter notes, upcoming appointments, etc.  Non-urgent messages can be sent to your provider as well.   To learn more about what you can do with MyChart, go to [email or web address].    Your next appointment:   12 month(s)  The format for your next appointment:   In Person  Provider:   Dr. Zoila Shutter

## 2022-05-05 ENCOUNTER — Telehealth: Payer: Self-pay | Admitting: Internal Medicine

## 2022-05-05 NOTE — Telephone Encounter (Signed)
Pt c/o medication issue:  1. Name of Medication: Evolocumab (REPATHA SURECLICK) 140 MG/ML SOAJ  2. How are you currently taking this medication (dosage and times per day)? As directed  3. Are you having a reaction (difficulty breathing--STAT)? no  4. What is your medication issue? Patient's wife is having issues with Repatha trying to get the discount but is not getting anywhere. The patient is out of medication and the patient is due to give himself a shot on Tuesday

## 2022-05-05 NOTE — Telephone Encounter (Signed)
Patient calling the office for samples of medication:   1.  What medication and dosage are you requesting samples for? Evolocumab (REPATHA SURECLICK) 140 MG/ML SOAJ  2.  Are you currently out of this medication? Yes  Please call wife

## 2022-05-06 NOTE — Telephone Encounter (Signed)
Repatha sample picked up by wife  Medication Samples have been provided to the patient.  Drug name: Repatha       Strength: 140mg         Qty: 1  LOT  Exp.Date: 08/06/24  Dosing instructions: Inject 37ml sq q 14 days  The patient has been instructed regarding the correct time, dose, and frequency of taking this medication, including desired effects and most common side effects.   0m 4:54 PM 05/06/2022

## 2022-05-06 NOTE — Telephone Encounter (Signed)
Patient's wife calling back. She says they are going out of town in the morning and he is out of medication. She states she needs to pick it up today.

## 2022-05-06 NOTE — Telephone Encounter (Signed)
Wife made aware that she can pick up sample at [facility] office.

## 2022-05-24 ENCOUNTER — Telehealth: Payer: Self-pay | Admitting: Internal Medicine

## 2022-05-24 NOTE — Telephone Encounter (Signed)
It looks like they tried to use a copay card, but he has a high deductible, so still over $300/month.  Dr. Rennis Golden will need to decide what other options for him  would be.

## 2022-05-24 NOTE — Telephone Encounter (Signed)
Pt c/o medication issue:  1. Name of Medication: Evolocumab (REPATHA SURECLICK) 140 MG/ML SOAJ  2. How are you currently taking this medication (dosage and times per day)? Inject 1 Dose into the skin every 14 (fourteen) days.  3. Are you having a reaction (difficulty breathing--STAT)? no  4. What is your medication issue? Wife called in saying what nurse told her to do to get the medication didn't work. Wife wants to try other medication because that one is too expensive. (He was due for meds on yesterday) Please advise

## 2022-05-25 NOTE — Telephone Encounter (Signed)
Patient's wife is following up to discuss less expensive alternatives for Repatha ASAP. I informed her Dr. Rennis Golden and his nurse are not in, but he is scheduled to return to the office tomorrow, 7/20. She is hopeful for advisement at that time is possible. If she is unavailable when call if returned she requested the patient be called as well at [phone].

## 2022-05-30 NOTE — Telephone Encounter (Signed)
Returned call to patient's wife she was very upset.Stated she called twice last week with no return call.Stated her husband can not afford Repatha.Stated she needs to speak to Dr.Hilty's RN.She would like her to call her back today.Advised she is out of office today.I will send message to her.

## 2022-05-30 NOTE — Telephone Encounter (Signed)
Pt's wife is calling back and is very insistent that she receive a call back today in regards to this and she states that she is tired of not getting a return call. She would like something else cheaper called in and doesn't understand why this can not be done.

## 2022-05-31 NOTE — Telephone Encounter (Signed)
Called Repatha Ready to check on co-pay program  Patient received 1st 3 refills at $5 but 4th refill co-pay card only takes off $200 from cost Patient's insurance is charging [phone] for 1 month of Repatha which leaves him with $348.33 balance per month  Repatha Ready rep said he could check with insurance: Make sure he is using pharmacy that is preferred or in-network See how far off he is from deductible being met

## 2022-05-31 NOTE — Telephone Encounter (Signed)
Contacted patient's wife but she is out of her house and her phone is low on battery. She asked for call back tomorrow

## 2022-06-01 NOTE — Telephone Encounter (Signed)
Hilty, Lisette Abu, MD  You; Johney Frame A, RN 6 days ago    As mentioned by Belenda Cruise - if he has a high deductible for meds - he would have to meet the cost of the deductible before medicine would be covered by insurance. That would not be different with other medications (such as Praluent or Leqvio).  I do not think he would qualify for any clinical trials at the moment.   Dr. Rennis Golden    Spoke with patient's wife about Repatha. Advised she check with his insurance to see if Praluent or Leqvio (billed under medical benefits) have better coverage. Patient is self-employed thus has to balance insurance costs/premiums.

## 2022-07-06 NOTE — Telephone Encounter (Signed)
Patient's wife is calling back stating it has been a month and she has not received a callback regarding this. She states they have 3 kids in college and don't want to pay the cost of this medication. She would like a callback today to discuss alternatives to try. Please advise.

## 2022-07-07 ENCOUNTER — Telehealth: Payer: Self-pay | Admitting: Internal Medicine

## 2022-07-07 NOTE — Telephone Encounter (Signed)
Follow Up:    Wife is calling back. She wants to know what was decided please. She needs to know something asap please.

## 2022-07-07 NOTE — Telephone Encounter (Signed)
Patient calling the office for samples of medication:   1.  What medication and dosage are you requesting samples for? Repatha  2.  Are you currently out of this medication?      

## 2022-07-07 NOTE — Telephone Encounter (Signed)
I do not need this encounter 

## 2022-07-07 NOTE — Telephone Encounter (Signed)
Follow Up:         Patient's wife returned call to [place].

## 2022-07-07 NOTE — Telephone Encounter (Signed)
Attempted to call patient's wife, left message for patient's wife to call back to office.

## 2022-07-12 NOTE — Telephone Encounter (Signed)
Called patient, spoke with wife- she was upset on the phone, explaining that she has not had anyone explain what is going on with the Repatha. I did advise with covering RN who advised they had received samples last month. However, they wanted to know if there was anything else that could be taken like Repatha. I advised with wife information below from Dr.Hilty- that the cost of the others would be the same. Patient was frustrated that they would have to pay so much, and I did advise it was the insurance plan. Sadly, we can not change this. She did advise that they would like to know if they can be on another medication that is not a statin- as they do not want to pay for the Repatha, and since there are no other options like Repatha for them they would like to know what else to do.  Patient wife states her mother in law is in hospice and she is there with her most of the time, and this is why she doesn't answer her phone- but she would like a call back/ she will call back if she unable to answer at that time.

## 2022-07-13 NOTE — Telephone Encounter (Signed)
Chrystie Nose, MD  Lindell Spar, RN Caller: Unspecified (1 month ago) Yes .Marland Kitchen If you can look into Leqvio that would be great - I'm not sure that there is any option that would not require meeting a deductible.   Dr Rexene Edison

## 2022-07-13 NOTE — Telephone Encounter (Signed)
Spoke with patient's wife. Was calling to explain other medication options - such as Leqvio - but wife was in a rush, heading into court. Asked her if she checked with her husband's insurance plan on his coverage for either Praluent or Leqvio, as previously mentioned to her in July (in this message thread), to which there was no correspondence back from her. 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 She states it is not a matter of them not being able to afford Repatha, but that Dr. Rennis Golden told them there would be something that may be more cost effective.   She asked that I email her the names of the other medications so she can check with patient's insurance on coverage. Advised I will email her a note with this so she can check.  Email: [email or web address]

## 2022-07-26 NOTE — Telephone Encounter (Signed)
Wife called stating she would like to get samples of the Evolocumab (REPATHA SURECLICK) 641 MG/ML SOAJ as patient is out of this medication.  Wife stated patient is out of town and she will need to collect the samples tomorrow as she will be going out of town as well.  Wife stated they will be following up with Dr. Debara Pickett regarding this medication when they return.

## 2022-07-26 NOTE — Telephone Encounter (Signed)
Returned the call to the patient's wife. She was calling to see if she can get a sample of Repatha "again". They are going out of town tomorrow. She is very frustrated and stated that when she gets back from being out of town that she wants a meeting with Dr Debara Pickett to discuss this concern.   She wants to come up here in the morning for the Repatha sample.

## 2022-07-27 NOTE — Telephone Encounter (Signed)
Left message to call back  

## 2022-07-27 NOTE — Telephone Encounter (Signed)
Pixie Casino, MD  You; Ricci Barker, RN 14 hours ago (5:18 PM)    We cannot indefinitely sample this medication -need to come up with another option if it is not affordable. Grants? Clinical research trial?   Dr Wilmon Arms are not available for commercially-insured patients. He has used co-pay card but that only takes off a set $$ amount from the copay and insurance dictates what is billed.   If there are clinical trial options, will you contact research for screening? I will pose this option to wife, if there is something available.

## 2022-07-27 NOTE — Telephone Encounter (Signed)
Pixie Casino, MD  Fidel Levy, RN Caller: Unspecified (2 weeks ago) No research options at this time since he does not have ASCVD - could consider Nexlizet

## 2022-08-01 NOTE — Telephone Encounter (Signed)
Wife is returning call. She states that if she doesn't answer please leave on VM if she can come pick up samples. Patient been a week without medication. Please advise

## 2022-08-01 NOTE — Telephone Encounter (Signed)
Called patient wife back. She is frustrated with getting samples of Repatha- I did advise of message below which mentions Nexlizet however, patient wife still speaks hateful on the phone advising that her husband has been over a week without medication and they needed something now, I did advise I would have to send to our pharmacy team to review.   Patient then advised that she requested a call back at a certain time and this was not completed- I advised with patient that at times we are in clinic and are not always able to call back when requested, although we do try our best.   She states "we can afford to pay for the [facility] but will not pay for it, why should we?" I did advise with patient if she was frustrated with speaking with multiple staff, jenna would hopefully be back in office tomorrow and she would review call.   Patient wife verbalized understanding, ended the call.   Will route to MD/RN to review.

## 2022-08-02 NOTE — Telephone Encounter (Signed)
Spoke with patient's wife. She is again frustrated that she has been dealing with the cost of Repatha issue since March. She was advised that I emailed her names of other medications to check with insurance on (almost 1 month ago) but she said she never got an email. She said her friend worked in the cath lab at [employer] and is now an Scientist, physiological there and told her that there are non-injectable, non-statin med options. Advised her of Praluent, Leqvio - injectables - which offer comparable LDL reduction. Advised her of Nexlizet - oral - which does not have as high of a % of LDL reduction. I advised wife that we cannot routinely supply samples. She said she will contact his insurance company to inquire about the other med options discussed and leave a message with the outcome/findings.

## 2022-12-15 ENCOUNTER — Telehealth: Payer: Self-pay | Admitting: Internal Medicine

## 2022-12-15 NOTE — Telephone Encounter (Signed)
PA for repatha submitted via CMM CaseId:[id number];Status:Approved;Review Type:Prior Auth;Coverage Start Date:12/15/2022;Coverage End Date:12/15/2023;

## 2023-01-15 ENCOUNTER — Other Ambulatory Visit: Payer: Self-pay | Admitting: Internal Medicine

## 2023-04-13 ENCOUNTER — Other Ambulatory Visit: Payer: Self-pay | Admitting: Internal Medicine

## 2023-04-13 DIAGNOSIS — E7801 Familial hypercholesterolemia: Secondary | ICD-10-CM

## 2023-04-13 DIAGNOSIS — E782 Mixed hyperlipidemia: Secondary | ICD-10-CM

## 2023-04-13 NOTE — Telephone Encounter (Signed)
Patient is scheduled 09/01/23 with Dr. Rennis Golden

## 2023-04-18 ENCOUNTER — Telehealth: Payer: Self-pay | Admitting: Internal Medicine

## 2023-04-18 DIAGNOSIS — E7801 Familial hypercholesterolemia: Secondary | ICD-10-CM

## 2023-04-18 DIAGNOSIS — E782 Mixed hyperlipidemia: Secondary | ICD-10-CM

## 2023-04-18 MED ORDER — REPATHA SURECLICK 140 MG/ML ~~LOC~~ SOAJ: 140.0000 mg | SUBCUTANEOUS | 0 refills | Status: DC

## 2023-04-18 MED ORDER — REPATHA SURECLICK 140 MG/ML ~~LOC~~ SOAJ: 140.0000 mg | SUBCUTANEOUS | 3 refills | Status: DC

## 2023-04-18 NOTE — Telephone Encounter (Signed)
  *  STAT* If patient is at the pharmacy, call can be transferred to refill team.   1. Which medications need to be refilled? (please list name of each medication and dose if known) Evolocumab (REPATHA SURECLICK) 140 MG/ML SOAJ   2. Which pharmacy/location (including street and city if local pharmacy) is medication to be sent to?   [facility] - [place], [place] - [address] Phone: [phone]  Fax: [phone]       3. Do they need a 30 day or 90 day supply? 90

## 2023-04-18 NOTE — Telephone Encounter (Signed)
Sample available ? 

## 2023-04-18 NOTE — Telephone Encounter (Signed)
Message sent to Pharm D. 

## 2023-04-18 NOTE — Telephone Encounter (Signed)
Patient calling the office for samples of medication:   1.  What medication and dosage are you requesting samples for?  Evolocumab (REPATHA SURECLICK) 140 MG/ML SOAJ   2.  Are you currently out of this medication? Yes   Pt wife states they are now [facility] but in the meantime they would like some samples if available. Please advise.

## 2023-04-18 NOTE — Telephone Encounter (Signed)
Refill sent in

## 2023-06-17 ENCOUNTER — Other Ambulatory Visit: Payer: Self-pay | Admitting: Internal Medicine

## 2023-06-17 DIAGNOSIS — E7801 Familial hypercholesterolemia: Secondary | ICD-10-CM

## 2023-06-17 DIAGNOSIS — E782 Mixed hyperlipidemia: Secondary | ICD-10-CM

## 2023-07-06 LAB — COMPREHENSIVE METABOLIC PANEL: EGFR: 71

## 2023-09-01 ENCOUNTER — Ambulatory Visit: Payer: Commercial Managed Care - HMO | Admitting: Internal Medicine

## 2023-10-09 NOTE — Progress Notes (Unsigned)
  Cardiology Office Note:  .   Date:  10/09/2023  ID:  Brian Andrade, DOB 1966/02/10, MRN [id number] PCP: Tally Joe, MD  [facility] Providers Cardiologist:  None { Click to update primary MD,subspecialty MD or APP then REFRESH:1}   Patient Profile: .      PMH Dyslipidemia Probable familial hyperlipidemia LDL > 190 Statin myalgia Family history CAD  Referred to lipid disorder clinic and seen by Dr. Rennis Golden 12/14/2021.  Had significant history of dyslipidemia and had been statin intolerant trying Crestor and Lipitor at various doses which caused myalgias.  His last LDL was 191 on once weekly statin.  Family history of heart disease including his father who had coronary disease and a stent at age 4.  Paternal grandfather also CV disease including prior stents and stroke.  His most recent lipid panel showed total cholesterol 263, triglycerides 207, HDL 44, and LDL 180.  His PCP attempted to prescribe Repatha, however insurance required specialist evaluation.  He was started on PCSK9 inhibitor.   At follow-up visit 04/26/2022 with Dr. Rennis Golden, he was tolerating Repatha without any concerning side effects.  LDL particle number improved to 778, LDL-C 66, HDL-C 38, and triglycerides 101.  Small particle number [id number].       History of Present Illness: .   Brian Andrade is a *** 57 y.o. male for follow-up of dyslipidemia. Most recent lipid panel on 07/06/23 reveals total cholesterol 131, triglycerides 140, LDL-C 65, and HDL 42.    CT calcium score?  Discussed the use of AI scribe software for clinical note transcription with the patient, who gave verbal consent to proceed.   ROS: ***       Studies Reviewed: .        *** Risk Assessment/Calculations:   {Does this patient have ATRIAL FIBRILLATION?:[id number]} No BP recorded.  {Refresh Note OR Click here to enter BP  :1}***       Physical Exam:   VS:  There were no vitals taken for this visit.   Wt Readings from Last 3 Encounters:   04/26/22 248 lb 4.8 oz (112.6 kg)  12/14/21 253 lb 9.6 oz (115 kg)  12/23/13 247 lb 12.8 oz (112.4 kg)    GEN: Well nourished, well developed in no acute distress NECK: No JVD; No carotid bruits CARDIAC: ***RRR, no murmurs, rubs, gallops RESPIRATORY:  Clear to auscultation without rales, wheezing or rhonchi  ABDOMEN: Soft, non-tender, non-distended EXTREMITIES:  No edema; No deformity     ASSESSMENT AND PLAN: .    Dyslipidemia LDL goal < 70:  CV Risk assessment:     {Are you ordering a CV Procedure (e.g. stress test, cath, DCCV, TEE, etc)?   Press F2        :[id number]}  Dispo: ***  Signed, Eligha Bridegroom, NP-C

## 2023-10-11 ENCOUNTER — Encounter (HOSPITAL_BASED_OUTPATIENT_CLINIC_OR_DEPARTMENT_OTHER): Payer: Self-pay | Admitting: Nurse Practitioner

## 2023-10-11 ENCOUNTER — Ambulatory Visit (HOSPITAL_BASED_OUTPATIENT_CLINIC_OR_DEPARTMENT_OTHER): Payer: Commercial Managed Care - HMO | Admitting: Nurse Practitioner

## 2023-10-11 ENCOUNTER — Encounter (HOSPITAL_BASED_OUTPATIENT_CLINIC_OR_DEPARTMENT_OTHER): Payer: Commercial Managed Care - HMO | Admitting: Nurse Practitioner

## 2023-10-11 VITALS — BP 122/81 | HR 64 | Ht 75.0 in | Wt 255.0 lb

## 2023-10-11 DIAGNOSIS — Z7189 Other specified counseling: Secondary | ICD-10-CM | POA: Diagnosis not present

## 2023-10-11 DIAGNOSIS — E785 Hyperlipidemia, unspecified: Secondary | ICD-10-CM

## 2023-10-11 DIAGNOSIS — Z8249 Family history of ischemic heart disease and other diseases of the circulatory system: Secondary | ICD-10-CM

## 2023-10-11 DIAGNOSIS — E7801 Familial hypercholesterolemia: Secondary | ICD-10-CM | POA: Diagnosis not present

## 2023-10-11 DIAGNOSIS — R109 Unspecified abdominal pain: Secondary | ICD-10-CM

## 2023-10-11 NOTE — Patient Instructions (Signed)
Medication Instructions:   Your physician recommends that you continue on your current medications as directed. Please refer to the Current Medication list given to you today.   *If you need a refill on your cardiac medications before your next appointment, please call your pharmacy*   Lab Work:  None ordered.  If you have labs (blood work) drawn today and your tests are completely normal, you will receive your results only by: MyChart Message (if you have MyChart) OR A paper copy in the mail If you have any lab test that is abnormal or we need to change your treatment, we will call you to review the results.   Testing/Procedures:  Brian Andrade has ordered a CT coronary calcium score.   Test locations:  [facility].  This is $99 out of pocket.   Coronary CalciumScan A coronary calcium scan is an imaging test used to look for deposits of calcium and other fatty materials (plaques) in the inner lining of the blood vessels of the heart (coronary arteries). These deposits of calcium and plaques can partly clog and narrow the coronary arteries without producing any symptoms or warning signs. This puts a person at risk for a heart attack. This test can detect these deposits before symptoms develop. Tell a health care provider about: Any allergies you have. All medicines you are taking, including vitamins, herbs, eye drops, creams, and over-the-counter medicines. Any problems you or family members have had with anesthetic medicines. Any blood disorders you have. Any surgeries you have had. Any medical conditions you have. Whether you are pregnant or may be pregnant. What are the risks? Generally, this is a safe procedure. However, problems may occur, including: Harm to a pregnant woman and her unborn baby. This test involves the use of radiation. Radiation exposure can be dangerous to a pregnant woman and her unborn baby. If you are pregnant, you generally should  not have this procedure done. Slight increase in the risk of cancer. This is because of the radiation involved in the test. What happens before the procedure? No preparation is needed for this procedure. What happens during the procedure? You will undress and remove any jewelry around your neck or chest. You will put on a hospital gown. Sticky electrodes will be placed on your chest. The electrodes will be connected to an electrocardiogram (ECG) machine to record a tracing of the electrical activity of your heart. A CT scanner will take pictures of your heart. During this time, you will be asked to lie still and hold your breath for 2-3 seconds while a picture of your heart is being taken. The procedure may vary among health care providers and hospitals. What happens after the procedure? You can get dressed. You can return to your normal activities. It is up to you to get the results of your test. Ask your health care provider, or the department that is doing the test, when your results will be ready. Summary A coronary calcium scan is an imaging test used to look for deposits of calcium and other fatty materials (plaques) in the inner lining of the blood vessels of the heart (coronary arteries). Generally, this is a safe procedure. Tell your health care provider if you are pregnant or may be pregnant. No preparation is needed for this procedure. A CT scanner will take pictures of your heart. You can return to your normal activities after the scan is done. This information is not intended to replace advice given to you by your  health care provider. Make sure you discuss any questions you have with your health care provider. Document Released: 04/21/2008 Document Revised: 09/12/2016 Document Reviewed: 09/12/2016 Elsevier Interactive Patient Education  2017 [employer].    Follow-Up: At [facility], you and your health needs are our priority.  As part of our continuing mission to  provide you with exceptional heart care, we have created designated Provider Care Teams.  These Care Teams include your primary Cardiologist (physician) and Advanced Practice Providers (APPs -  Physician Assistants and Nurse Practitioners) who all work together to provide you with the care you need, when you need it.  We recommend signing up for the patient portal called "MyChart".  Sign up information is provided on this After Visit Summary.  MyChart is used to connect with patients for Virtual Visits (Telemedicine).  Patients are able to view lab/test results, encounter notes, upcoming appointments, etc.  Non-urgent messages can be sent to your provider as well.   To learn more about what you can do with MyChart, go to [email or web address].    Your next appointment:   1 year(s)  Provider:   K. Italy Hilty, MD or Eligha Bridegroom, NP    Other Instructions   Your physician wants you to follow-up in: 1 year.  You will receive a reminder letter in the mail two months in advance. If you don't receive a letter, please call our office to schedule the follow-up appointment.

## 2023-10-11 NOTE — Progress Notes (Signed)
Cardiology Office Note:  .   Date:  10/11/2023  ID:  Brian Andrade, DOB 1966-09-08, MRN [id number] PCP: Tally Joe, MD  [facility] Providers Cardiologist:  None    Patient Profile: .      PMH Dyslipidemia Probable familial hyperlipidemia LDL > 190 Statin myalgia Family history CAD Father had stent age 64s, still living in his 76s  Referred to lipid disorder clinic and seen by Dr. Rennis Golden 12/14/2021.  Had significant history of dyslipidemia and had been statin intolerant trying Crestor and Lipitor at various doses which caused myalgias.  His last LDL was 191 on once weekly statin.  Family history of heart disease including his father who had coronary disease and a stent at age 68.  Paternal grandfather also CV disease including prior stents and stroke.  His most recent lipid panel showed total cholesterol 263, triglycerides 207, HDL 44, and LDL 180.  His PCP attempted to prescribe Repatha, however insurance required specialist evaluation.  He was started on PCSK9 inhibitor.   At follow-up visit 04/26/2022 with Dr. Rennis Golden, he was tolerating Repatha without any concerning side effects.  LDL particle number improved to 778, LDL-C 66, HDL-C 38, and triglycerides 101.  Small particle number 396.       History of Present Illness: .   Brian Andrade is a very pleasant  57 y.o. male for follow-up of dyslipidemia. Most recent lipid panel on 07/06/23 reveals total cholesterol 131, triglycerides 140, LDL-C 65, and HDL 42. He reports pain under his left rib cage for 5-6 months. Described as tenderness and exacerbated by certain movements. Pain is not severe but is persistent, no trauma or injury to the area. Had X-ray and consultation with an orthopedic specialist, but no cause has been identified. Is taking meloxicam for shoulder pain, which has been effective. He has not noticed any side effects from the Repatha, other than possible mild joint pain.  He remains active working in [employer]. He denies  chest pain, palpitations, shortness of breath, orthopnea, PND, edema, presyncope or syncope. Acknowledges that he feels "out of shape at times" but no consistent symptoms associated with exertion.  No activity intolerance. He is no longer taking aspirin.   Discussed the use of AI scribe software for clinical note transcription with the patient, who gave verbal consent to proceed.   ROS: See HPI       Studies Reviewed: Marland Kitchen   EKG Interpretation Date/Time:  Wednesday October 11 2023 15:55:15 EST Ventricular Rate:  64 PR Interval:  186 QRS Duration:  100 QT Interval:  386 QTC Calculation: 398 R Axis:   -31  Text Interpretation: Normal sinus rhythm Left axis deviation No ST abnormality Confirmed by Eligha Bridegroom [phone]) on 10/11/2023 3:59:59 PM     Risk Assessment/Calculations:             Physical Exam:   VS:  BP 122/81 (BP Location: Left Arm, Patient Position: Sitting, Cuff Size: Normal)   Pulse 64   Ht 6\' 3"  (1.905 m)   Wt 255 lb (115.7 kg)   SpO2 94%   BMI 31.87 kg/m    Wt Readings from Last 3 Encounters:  10/11/23 255 lb (115.7 kg)  04/26/22 248 lb 4.8 oz (112.6 kg)  12/14/21 253 lb 9.6 oz (115 kg)    GEN: Well nourished, well developed in no acute distress NECK: No JVD; No carotid bruits CARDIAC: RRR, no murmurs, rubs, gallops RESPIRATORY:  Clear to auscultation without rales, wheezing or rhonchi  ABDOMEN: Soft, non-tender, non-distended EXTREMITIES:  No edema; No deformity     ASSESSMENT AND PLAN: .    Dyslipidemia LDL goal < 70/Familial hyperlipidemia: Lipid panel 07/06/2023 with total cholesterol 131, HDL 42, LDL 65, and triglycerides 161.  He reports minimal joint pain associated with Repatha which he is able to tolerate, nothing like what he felt when taking statins. Encouraged ASCVD risk reduction including heart healthy mostly plant based diet avoiding saturated fat, processed foods, simple carbohydrates, and sugar along with aiming for at least 150 minutes of  moderate intensity exercise each week.  If lipid panel not completed with PCP prior to office visit, we will order NMR. Continue Repatha.  CV Risk assessment: We discussed cardiac risk based on history of hyperlipidemia and family history.  He would like to get CT calcium score for further risk stratification.  Recommended he could remain off aspirin for now.  We will consider restarting low-dose aspirin if there is evidence of coronary calcification.  Left side pain: Pain under her left rib cage for 5 to 6 months. Worsens with certain movements and with palpation. No indication of splenomegaly on exam. Has been seen by ortho and has taken anti-inflammatory with no improvement. Encouraged follow-up with PCP.         Dispo: 1 year with Dr. Rennis Golden or me   Signed, Eligha Bridegroom, NP-C

## 2023-11-02 ENCOUNTER — Ambulatory Visit (HOSPITAL_BASED_OUTPATIENT_CLINIC_OR_DEPARTMENT_OTHER)
Admission: RE | Admit: 2023-11-02 | Discharge: 2023-11-02 | Disposition: A | Discharge status: Home / Self Care | Payer: Commercial Managed Care - HMO | Source: Ambulatory Visit | Attending: Nurse Practitioner | Admitting: Nurse Practitioner

## 2023-11-02 DIAGNOSIS — E7801 Familial hypercholesterolemia: Secondary | ICD-10-CM | POA: Insufficient documentation

## 2023-11-15 ENCOUNTER — Other Ambulatory Visit: Payer: Self-pay | Admitting: Nurse Practitioner

## 2023-11-15 DIAGNOSIS — R109 Unspecified abdominal pain: Secondary | ICD-10-CM

## 2023-11-16 ENCOUNTER — Ambulatory Visit
Admission: RE | Admit: 2023-11-16 | Discharge: 2023-11-16 | Disposition: A | Discharge status: Home / Self Care | Payer: Commercial Managed Care - HMO | Source: Ambulatory Visit | Attending: Nurse Practitioner | Admitting: Nurse Practitioner

## 2023-11-16 DIAGNOSIS — R109 Unspecified abdominal pain: Secondary | ICD-10-CM

## 2023-12-12 ENCOUNTER — Other Ambulatory Visit (HOSPITAL_COMMUNITY): Payer: Self-pay

## 2024-01-11 ENCOUNTER — Telehealth: Payer: Self-pay | Admitting: Pharmacy Technician

## 2024-01-11 ENCOUNTER — Other Ambulatory Visit (HOSPITAL_COMMUNITY): Payer: Self-pay

## 2024-01-11 NOTE — Telephone Encounter (Signed)
 Pharmacy Patient Advocate Encounter   Received notification from CoverMyMeds that prior authorization for repatha is required/requested.   Insurance verification completed.   The patient is insured through [employer] .   Per test claim: PA required; PA submitted to above mentioned insurance via CoverMyMeds Key/confirmation #/EOC [id number] Status is pending

## 2024-01-12 ENCOUNTER — Other Ambulatory Visit (HOSPITAL_COMMUNITY): Payer: Self-pay

## 2024-01-12 NOTE — Telephone Encounter (Signed)
 Pharmacy Patient Advocate Encounter  Received notification from [employer] that Prior Authorization for Repatha has been APPROVED from 01/11/24 to 01/09/25. Ran test claim, Copay is $24.99- one month. This test claim was processed through [facility]- copay amounts may vary at other pharmacies due to pharmacy/plan contracts, or as the patient moves through the different stages of their insurance plan.   PA #/Case ID/Reference #: [id number]

## 2024-06-21 ENCOUNTER — Other Ambulatory Visit: Payer: Self-pay | Admitting: Internal Medicine

## 2024-06-21 DIAGNOSIS — E782 Mixed hyperlipidemia: Secondary | ICD-10-CM

## 2024-06-21 DIAGNOSIS — E7801 Familial hypercholesterolemia: Secondary | ICD-10-CM

## 2024-07-29 ENCOUNTER — Telehealth: Payer: Self-pay | Admitting: Internal Medicine

## 2024-07-29 DIAGNOSIS — E782 Mixed hyperlipidemia: Secondary | ICD-10-CM

## 2024-07-29 DIAGNOSIS — E7801 Familial hypercholesterolemia: Secondary | ICD-10-CM

## 2024-07-29 NOTE — Telephone Encounter (Signed)
*  STAT* If patient is at the pharmacy, call can be transferred to refill team.   1. Which medications need to be refilled? (please list name of each medication and dose if known) Evolocumab  (REPATHA  SURECLICK) 140 MG/ML SOAJ  2. Which pharmacy/location (including street and city if local pharmacy) is medication to be sent to? [id number] - [place], [place] - [address]  3. Do they need a 30 day or 90 day supply?  30 day supply  Wife says they were informed that with the card for the medication they could only do a 90 day supply. Please advise.

## 2024-07-30 NOTE — Telephone Encounter (Signed)
 Pt's wife is calling to state that she will drop by [facility] office to pick up Repatha  pens. Please advise.

## 2024-07-31 NOTE — Telephone Encounter (Signed)
 Pt's spouse is requesting a callback regarding medication repatha  from PharmD. She'd like to be called at [phone]. Please advise

## 2024-08-01 MED ORDER — REPATHA SURECLICK 140 MG/ML ~~LOC~~ SOAJ: 140.0000 mg | SUBCUTANEOUS | 3 refills | Status: AC

## 2024-08-01 NOTE — Telephone Encounter (Signed)
 Patient's wife called upset that no one has called her back. Was not sure what she needed since patient has an active Repatha  prescription on file. Requests it be updated for 90 days. Also requests a sample. Will have sample ready at the front desk.

## 2024-08-19 ENCOUNTER — Telehealth: Payer: Self-pay | Admitting: Internal Medicine

## 2024-08-19 DIAGNOSIS — E78019 Familial hypercholesterolemia, unspecified: Secondary | ICD-10-CM

## 2024-08-19 DIAGNOSIS — E782 Mixed hyperlipidemia: Secondary | ICD-10-CM

## 2024-08-19 NOTE — Telephone Encounter (Signed)
 Left message for patient/Brian Andrade to call back.

## 2024-08-19 NOTE — Telephone Encounter (Signed)
 Patient calling the office for samples of medication:   1.  What medication and dosage are you requesting samples for?   Evolocumab  (REPATHA  SURECLICK) 140 MG/ML SOAJ    2.  Are you currently out of this medication?  Yes, pt is out of medication.

## 2024-08-20 NOTE — Telephone Encounter (Signed)
 Called, left VM requesting call back.   Darrly Loberg S Epiphany Seltzer, NP

## 2024-08-22 NOTE — Telephone Encounter (Signed)
 Spoke with wife. She is frustrated by length of time for call back. Discussed we called her husband's number x 2. Updated her number to preferred number.  She notes Repatha  is expensive. Appreciated Chris Pavero, PharmD sending in 90 day supply. Has used max dollar amount for copay card - discussed copay card will renew 11/07/24.   Reports willing to pay increased cost til end of year for Repatha , able to afford. Requests if possible one more sample, will route to Medford Kins, PharmD for review.   Requests communications be through her cell phone, [phone]  Brian GORMAN Finder, NP

## 2024-08-23 NOTE — Telephone Encounter (Signed)
 Pavero, Christopher, RPH to Brian Reche RAMAN, NP  Cv Div Dwb Triage (Selected Message)     08/23/24  8:54 AM Yes patient can have a Repatha  sample   Called and let patient's wife know there will be a Repatha  sample available for pick up today.  She will be coming by today to pick up.

## 2024-09-04 ENCOUNTER — Ambulatory Visit: Payer: Self-pay | Admitting: Internal Medicine

## 2024-09-05 MED ORDER — REPATHA SURECLICK 140 MG/ML ~~LOC~~ SOAJ: 1.0000 mL | SUBCUTANEOUS | Status: AC

## 2024-09-05 NOTE — Addendum Note (Signed)
 Addended by: DARRELL BRUCKNER on: 09/05/2024 04:49 PM   Modules accepted: Orders

## 2024-10-24 ENCOUNTER — Ambulatory Visit (HOSPITAL_BASED_OUTPATIENT_CLINIC_OR_DEPARTMENT_OTHER): Admitting: Internal Medicine

## 2024-10-24 VITALS — BP 116/76 | HR 59 | Ht 75.0 in | Wt 245.0 lb

## 2024-10-24 DIAGNOSIS — E785 Hyperlipidemia, unspecified: Secondary | ICD-10-CM | POA: Diagnosis not present

## 2024-10-24 DIAGNOSIS — E78019 Familial hypercholesterolemia, unspecified: Secondary | ICD-10-CM

## 2024-10-24 DIAGNOSIS — I77810 Thoracic aortic ectasia: Secondary | ICD-10-CM

## 2024-10-24 NOTE — Patient Instructions (Signed)
 Medication Instructions:  NO CHANGES  *If you need a refill on your cardiac medications before your next appointment, please call your pharmacy*  Testing/Procedures: CT angiogram chest aorta -- to follow up on aorta size/diameter  Follow-Up: At [facility], you and your health needs are our priority.  As part of our continuing mission to provide you with exceptional heart care, our providers are all part of one team.  This team includes your primary Cardiologist (physician) and Advanced Practice Providers or APPs (Physician Assistants and Nurse Practitioners) who all work together to provide you with the care you need, when you need it.  Your next appointment:    12 months with Rosaline Bane NP at [facility]   We recommend signing up for the patient portal called MyChart.  Sign up information is provided on this After Visit Summary.  MyChart is used to connect with patients for Virtual Visits (Telemedicine).  Patients are able to view lab/test results, encounter notes, upcoming appointments, etc.  Non-urgent messages can be sent to your provider as well.   To learn more about what you can do with MyChart, go to [email or web address].   Other Instructions

## 2024-10-24 NOTE — Progress Notes (Signed)
 LIPID CLINIC CONSULT NOTE  Chief Complaint:  Follow-up dyslipidemia  Primary Care Physician: Seabron Lenis, MD  Primary Cardiologist:  None  HPI:  Brian Andrade is a 58 y.o. male who is being seen today for the evaluation of dyslipidemia at the request of Seabron Lenis, MD.  This is a pleasant 58 year old male kindly referred for evaluation management of dyslipidemia.  He has a past medical history significant for dyslipidemia but has been statin intolerant having tried Crestor and Lipitor at varying doses which caused some myalgias.  His last LDL was 191 on once weekly statin.  There is a family history of heart disease including his father who had coronary disease and a stent at age 76.  Paternal grandfather also had cardiovascular disease including prior stents and stroke.  His most recent lipid profile showed total cholesterol 263, triglycerides 207, HDL 44 and LDL 180.  His PCP attempted to prescribe Repatha  however insurance required specialist evaluation.  04/26/2022  Brian Andrade returns today for follow-up.  He is done very well on Repatha .  He denies any side effects or muscle aches that he previously had on the statins.  In addition his cholesterol has come down significantly.  LDL particle number now 778, LDL-C of 66, HDL-C of 38 and triglycerides of 101.  Small LDL particle number of 396.  This represents a significant improvement in his lipids and now he is at target.  Overall he is happy with the medication although cost has recently increased.  We have reached out to the company to see about options to try to reduce the cost.  He mentioned he would like a sample today if possible.  10/24/2024  Brian Andrade returns today for follow-up.  He has done very well over the past year.  He continues to have some lower rib cage pain on the left and right side.  No clear etiology was noted however it does sound like it is intercostal pain.  He had a calcium score last year which was 3, 44th  percentile for age and sex matched controls.  The ascending aorta was mildly dilated at 40 mm which is a little generous even if indexed to his body surface area as he is 6 foot 3.  Cholesterol in October showed total 140, triglycerides 222, HDL 43 and LDL 61.  He reported he was somewhat slack on his diet at that time and less active.  Since then he has increased his exercise.  He did recently run out of Repatha  because of cost issues.  His co-pay assistance card was not providing him with benefit.  This should improve after January.  PMHx:  Past Medical History:  Diagnosis Date   Abdominal pain    lower left   Elevated cholesterol with elevated triglycerides    Family history of early CAD    GERD (gastroesophageal reflux disease)    Left inguinal hernia     Past Surgical History:  Procedure Laterality Date   HERNIA REPAIR  1975, 2010    FAMHx:  Family History  Problem Relation Age of Onset   Hyperlipidemia Mother    Cancer Mother        breast   Heart disease Father    Hyperlipidemia Father    Hyperlipidemia Paternal Grandmother    Hypertension Paternal Grandmother    Diabetes Paternal Grandmother    Cancer Maternal Grandfather        lung    SOCHx:   reports that he has  never smoked. He has quit using smokeless tobacco. He reports that he does not drink alcohol and does not use drugs.  ALLERGIES:  Allergies  Allergen Reactions   Celebrex [Celecoxib] Other (See Comments)    Runs fever and messes with his head. Causes dizziness   Crestor [Rosuvastatin]     ROS: Pertinent items noted in HPI and remainder of comprehensive ROS otherwise negative.  HOME MEDS: Current Outpatient Medications on File Prior to Visit  Medication Sig Dispense Refill   cholecalciferol (VITAMIN D3) 25 MCG (1000 UNIT) tablet Take 1,000 Units by mouth daily.     Coenzyme Q10 100 MG capsule Take by mouth.      esomeprazole (NEXIUM) 40 MG capsule 1 capsule     ibuprofen (ADVIL,MOTRIN) 800 MG  tablet as needed.     Multiple Vitamin (MULTIVITAMIN WITH MINERALS) TABS tablet Take 1 tablet by mouth daily.     Omega-3 Fatty Acids (FISH OIL) 600 MG CAPS Take 4 tablets by mouth daily.     sertraline (ZOLOFT) 50 MG tablet Take by mouth.      Evolocumab  (REPATHA  SURECLICK) 140 MG/ML SOAJ Inject 140 mg into the skin every 14 (fourteen) days. (Patient not taking: Reported on 10/24/2024) 6 mL 3   Evolocumab  (REPATHA  SURECLICK) 140 MG/ML SOAJ Inject 140 mg into the skin every 14 (fourteen) days.     meloxicam  (MOBIC ) 15 MG tablet Take 15 mg by mouth daily.     No current facility-administered medications on file prior to visit.    LABS/IMAGING: No results found for this or any previous visit (from the past 48 hours). No results found.  LIPID PANEL: Lipid Panel w/reflex   2021-06-24    Cholesterol 263   <200  CHOL/HDL 6.0   2.0-4.0  HDLD 44   30-70  Triglyceride 207   0-199  NHDL 219   0-129  LDL Chol Calc (NIH) 180   9-00  LDL Chol Calc (NIH) 180   9-00   WEIGHTS: Wt Readings from Last 3 Encounters:  10/24/24 245 lb (111.1 kg)  10/11/23 255 lb (115.7 kg)  04/26/22 248 lb 4.8 oz (112.6 kg)    VITALS: BP 116/76 (Cuff Size: Normal)   Pulse (!) 59   Ht 6' 3 (1.905 m)   Wt 245 lb (111.1 kg)   BMI 30.62 kg/m   EXAM: General appearance: alert and no distress Lungs: clear to auscultation bilaterally Heart: regular rate and rhythm, S1, S2 normal, no murmur, click, rub or gallop Extremities: extremities normal, atraumatic, no cyanosis or edema Neurologic: Grossly normal   EKG: EKG Interpretation Date/Time:  Thursday October 24 2024 08:20:32 EST Ventricular Rate:  59 PR Interval:  182 QRS Duration:  102 QT Interval:  392 QTC Calculation: 388 R Axis:   -6  Text Interpretation: Sinus bradycardia When compared with ECG of 11-Oct-2023 15:55, No significant change was found Confirmed by Mona Kent [phone]) on 10/24/2024 8:23:55 AM    ASSESSMENT: Probable familial  hyperlipidemia, LDL 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   Brian Andrade seems to be doing well with a low calcium score and has had good control of his cholesterol except for triglycerides which have been elevated primarily due to dietary reasons.  He will work to get reestablished on Repatha  and diet and lifestyle as well.  Will go ahead and obtain a CT  angiogram of the aorta because he had mild dilatation on his CT calcium score last year.  Plan follow-up with Rosaline annually or sooner as necessary.  Brian KYM Maxcy, MD, [facility], FNLA, FACP  [place]  [facility]  Medical Director of the [facility]id [facility]  Card[facility]ovascular Risk Reduction [facility] Diplomate of the American Board of Clinical Lipidology Attending Cardiologist  Direct Dial: [phone]  Fax: [phone]  Website:  [email or web address] Brian Andrade 10/24/2024, 8:24 AM

## 2024-10-28 ENCOUNTER — Ambulatory Visit (HOSPITAL_COMMUNITY)
Admission: RE | Admit: 2024-10-28 | Discharge: 2024-10-28 | Disposition: A | Discharge status: Home / Self Care | Source: Ambulatory Visit | Attending: Internal Medicine | Admitting: Internal Medicine

## 2024-10-28 DIAGNOSIS — I77810 Thoracic aortic ectasia: Secondary | ICD-10-CM | POA: Insufficient documentation

## 2024-10-28 MED ORDER — IOHEXOL 350 MG/ML SOLN
100.0000 mL | Freq: Once | INTRAVENOUS | Status: AC | PRN
  Administered 2024-10-28: 100 mL via INTRAVENOUS

## 2024-11-12 ENCOUNTER — Ambulatory Visit: Payer: Self-pay | Admitting: Internal Medicine
